# Patient Record
Sex: Female | Born: 1985 | Hispanic: Yes | Marital: Married | Smoking: Never smoker
Health system: Southern US, Community
[De-identification: ages and names within clinical notes are randomized; demographics above are authoritative.]

## PROBLEM LIST (undated history)

## (undated) HISTORY — PX: CHOLECYSTECTOMY: SHX55

## (undated) HISTORY — PX: APPENDECTOMY: SHX54

---

## 2008-11-16 ENCOUNTER — Inpatient Hospital Stay: Payer: Self-pay | Admitting: Vascular Surgery

## 2010-05-22 ENCOUNTER — Observation Stay: Payer: Self-pay | Admitting: Obstetrics and Gynecology

## 2010-06-10 ENCOUNTER — Observation Stay: Payer: Self-pay | Admitting: Obstetrics and Gynecology

## 2010-08-19 ENCOUNTER — Observation Stay: Payer: Self-pay | Admitting: Obstetrics and Gynecology

## 2010-09-25 ENCOUNTER — Inpatient Hospital Stay: Payer: Self-pay

## 2010-09-30 ENCOUNTER — Inpatient Hospital Stay: Payer: Self-pay | Admitting: Surgery

## 2010-10-17 ENCOUNTER — Emergency Department: Payer: Self-pay | Admitting: Emergency Medicine

## 2010-10-19 ENCOUNTER — Inpatient Hospital Stay: Payer: Self-pay | Admitting: Surgery

## 2010-10-22 LAB — PATHOLOGY REPORT

## 2012-05-03 ENCOUNTER — Observation Stay: Payer: Self-pay

## 2012-05-15 ENCOUNTER — Inpatient Hospital Stay: Payer: Self-pay | Admitting: Obstetrics and Gynecology

## 2012-05-15 LAB — CBC WITH DIFFERENTIAL/PLATELET
Basophil #: 0 10*3/uL (ref 0.0–0.1)
Basophil %: 0.2 %
Eosinophil #: 0 10*3/uL (ref 0.0–0.7)
Eosinophil %: 0.5 %
HCT: 30.1 % — ABNORMAL LOW (ref 35.0–47.0)
HGB: 10.4 g/dL — ABNORMAL LOW (ref 12.0–16.0)
Lymphocyte %: 22.4 %
MCH: 27.7 pg (ref 26.0–34.0)
MCHC: 34.5 g/dL (ref 32.0–36.0)
MCV: 80 fL (ref 80–100)
Monocyte #: 0.6 x10 3/mm (ref 0.2–0.9)
Neutrophil %: 69.1 %
Platelet: 221 10*3/uL (ref 150–440)
RDW: 15.3 % — ABNORMAL HIGH (ref 11.5–14.5)

## 2014-12-19 NOTE — H&P (Signed)
L&D Evaluation:  History:   HPI 29 y/o G2P1001 @ 40/1wks EDC4 05/14/12 arrives with c/o irregular contractions and "baby moving less than normal". Denies leaking fluid, small bloody show. Care @ [facility], well pregnancy    Presents with abdominal pain, decreased fetal movement    Patient's Medical History No Chronic Illness    Patient's Surgical History Appendectomy  Colecystectomy    Medications Pre Natal Vitamins    Allergies NKDA    Social History none    Family History Non-Contributory   ROS:   ROS All systems were reviewed.  HEENT, CNS, GI, GU, Respiratory, CV, Renal and Musculoskeletal systems were found to be normal.   Exam:   Vital Signs stable    General no apparent distress    Mental Status clear    Chest clear    Heart normal sinus rhythm    Abdomen gravid, non-tender    Estimated Fetal Weight Average for gestational age    Fetal Position vtx    Fundal Height term    Back no CVAT    Edema no edema    Reflexes 1+    Clonus negative    Pelvic no external lesions, 5cm 70% vtx @ -1 BOWi nl show    Mebranes Intact    FHT normal rate with no decels, baseline 140's 150's avg variability occ accel    Fetal Heart Rate 144    Ucx irregular    Skin dry    Lymph no lymphadenopathy   Impression:   Impression early labor   Plan:   Plan monitor contractions and for cervical change    Comments FHR decel while pt moving in bed? Down to 90-100's x 2 minutes, back to baseline with repositioing. Admitted knows what to expect 2nd baby. Will begin gentle pitocin augment. Plans epidural with labor progress. Family supportive, at bedside.   Electronic Signatures: Albertina ParrLugiano, Ferol Laiche B (CNM)  (Signed 05-Oct-13 19:45)  Authored: L&D Evaluation   Last Updated: 05-Oct-13 19:45 by Albertina ParrLugiano, Rakesha Dalporto B (CNM)

## 2016-02-09 ENCOUNTER — Encounter: Payer: Self-pay | Admitting: Emergency Medicine

## 2016-02-09 ENCOUNTER — Emergency Department: Payer: Managed Care, Other (non HMO)

## 2016-02-09 ENCOUNTER — Emergency Department
Admission: EM | Admit: 2016-02-09 | Discharge: 2016-02-09 | Disposition: A | Discharge status: Home / Self Care | Payer: Managed Care, Other (non HMO) | Attending: Emergency Medicine | Admitting: Emergency Medicine

## 2016-02-09 DIAGNOSIS — W1839XA Other fall on same level, initial encounter: Secondary | ICD-10-CM | POA: Diagnosis not present

## 2016-02-09 DIAGNOSIS — S8992XA Unspecified injury of left lower leg, initial encounter: Secondary | ICD-10-CM | POA: Diagnosis present

## 2016-02-09 DIAGNOSIS — S90112A Contusion of left great toe without damage to nail, initial encounter: Secondary | ICD-10-CM | POA: Diagnosis not present

## 2016-02-09 DIAGNOSIS — Y9289 Other specified places as the place of occurrence of the external cause: Secondary | ICD-10-CM | POA: Diagnosis not present

## 2016-02-09 DIAGNOSIS — Y999 Unspecified external cause status: Secondary | ICD-10-CM | POA: Insufficient documentation

## 2016-02-09 DIAGNOSIS — Y9301 Activity, walking, marching and hiking: Secondary | ICD-10-CM | POA: Diagnosis not present

## 2016-02-09 DIAGNOSIS — W19XXXA Unspecified fall, initial encounter: Secondary | ICD-10-CM

## 2016-02-09 DIAGNOSIS — S80812A Abrasion, left lower leg, initial encounter: Secondary | ICD-10-CM | POA: Diagnosis not present

## 2016-02-09 DIAGNOSIS — S93491A Sprain of other ligament of right ankle, initial encounter: Secondary | ICD-10-CM | POA: Diagnosis not present

## 2016-02-09 MED ORDER — MELOXICAM 15 MG PO TABS: 15.0000 mg | ORAL_TABLET | Freq: Every day | ORAL | Status: DC

## 2016-02-09 NOTE — ED Provider Notes (Signed)
[facility] Emergency Department Provider Note  ____________________________________________  Time seen: Approximately 7:53 PM  I have reviewed the triage vital signs and the nursing notes.   HISTORY  Chief Complaint Fall    HPI Sonia Cunningham is a 30 y.o. female who is his emergency department complaining of right ankle pain and left great toe pain. Patient states that she was walking down her driveway when she accidentally rolled her right ankle and fell. Patient states in that process she suffered abrasions to her left shin and hurt her great toe left foot. Patient denies hitting her head or lose consciousness. Patient reports that the pain to the right ankle is in the posterior lateral aspect of the ankle. Patient is current on all immunizations. She has not had any medication prior to arrival. Patient is able to bear weight on both feet.   History reviewed. No pertinent past medical history.  There are no active problems to display for this patient.   Past Surgical History  Procedure Laterality Date  . Appendectomy    . Cholecystectomy      Current Outpatient Rx  Name  Route  Sig  Dispense  Refill  . meloxicam (MOBIC) 15 MG tablet   Oral   Take 1 tablet (15 mg total) by mouth daily.   30 tablet   0     Allergies Review of patient's allergies indicates no known allergies.  No family history on file.  Social History Social History  Substance Use Topics  . Smoking status: Never Smoker   . Smokeless tobacco: None  . Alcohol Use: No     Review of Systems  Constitutional: No fever/chills Cardiovascular: no chest pain. Respiratory: no cough. No SOB. Musculoskeletal: Positive for right ankle pain. Positive for left great toe pain. Skin: Positive for abrasions to the left shin. Neurological: Negative for headaches, focal weakness or numbness. 10-point ROS otherwise negative.  ____________________________________________   PHYSICAL  EXAM:  VITAL SIGNS: ED Triage Vitals  Enc Vitals Group     BP 02/09/16 1814 123/85 mmHg     Pulse Rate 02/09/16 1814 77     Resp 02/09/16 1814 18     Temp 02/09/16 1814 98.6 F (37 C)     Temp Source 02/09/16 1814 Oral     SpO2 --      Weight 02/09/16 1814 155 lb (70.308 kg)     Height 02/09/16 1814 5\' 5"  (1.651 m)     Head Cir --      Peak Flow --      Pain Score 02/09/16 1818 6     Pain Loc --      Pain Edu? --      Excl. in GC? --      Constitutional: Alert and oriented. Well appearing and in no acute distress. Eyes: Conjunctivae are normal. PERRL. EOMI. Head: Atraumatic. Cardiovascular: Normal rate, regular rhythm. Normal S1 and S2.  Good peripheral circulation. Respiratory: Normal respiratory effort without tachypnea or retractions. Lungs CTAB. Good air entry to the bases with no decreased or absent breath sounds. Musculoskeletal: No visible deformity noted to right ankle but inspection. Full range of motion to ankle joint. Patient is diffusely tender to palpation along the posterior talofibular ligament. No palpable abnormality. Dorsalis pedis pulse intact. Sensation intact all digits right foot. No deformity noted to great toe left foot. Patient is a little flex and extend toe appropriately. Patient is tender to palpation over the proximal phalanx. No palpable abnormality. Sensation  and cap refill intact to this digit. Neurologic:  Normal speech and language. No gross focal neurologic deficits are appreciated.  Skin:  Skin is warm, dry and intact. No rash noted. 2 abrasions are noted to the anterior left lower leg. No bleeding at This time. No visible foreign body. Areas are scabbed over. Psychiatric: Mood and affect are normal. Speech and behavior are normal. Patient exhibits appropriate insight and judgement.   ____________________________________________   LABS (all labs ordered are listed, but only abnormal results are displayed)  Labs Reviewed - No data to  display ____________________________________________  EKG   ____________________________________________  RADIOLOGY Festus BarrenI, Leman Martinek D Bronco Mcgrory, personally viewed and evaluated these images (plain radiographs) as part of my medical decision making, as well as reviewing the written report by the radiologist.  Dg Ankle Complete Right  02/09/2016  CLINICAL DATA:  30 year old female with fall and right ankle pain. EXAM: RIGHT ANKLE - COMPLETE 3+ VIEW COMPARISON:  None. FINDINGS: There is no acute fracture or dislocation. The ankle mortise is intact there is mild degenerative changes and osteophyte formation a small posterior aspect of the ankle joint. The soft tissues appear unremarkable. No radiopaque foreign object. IMPRESSION: Negative. Electronically Signed   By: Elgie CollardArash  Radparvar M.D.   On: 02/09/2016 19:32   Dg Foot Complete Left  02/09/2016  CLINICAL DATA:  30 year old female with fall and left foot injury EXAM: LEFT FOOT - COMPLETE 3+ VIEW COMPARISON:  None. FINDINGS: There is no evidence of fracture or dislocation. There is no evidence of arthropathy or other focal bone abnormality. Soft tissues are unremarkable. IMPRESSION: Negative. Electronically Signed   By: Elgie CollardArash  Radparvar M.D.   On: 02/09/2016 19:40    ____________________________________________    PROCEDURES  Procedure(s) performed:       Medications - No data to display   ____________________________________________   INITIAL IMPRESSION / ASSESSMENT AND PLAN / ED COURSE  Pertinent labs & imaging results that were available during my care of the patient were reviewed by me and considered in my medical decision making (see chart for details).  Patient's diagnosis is consistent with right ankle sprain, left toe contusion, abrasions to the left leg. Exam is reassuring. Imaging reveals no acute osseous abnormality. Patient is given Ace bandage in the emergency department.. Patient will be discharged home with prescriptions  for anti-inflammatories for symptom relief. Patient is to follow up with primary care provider as needed or otherwise directed. Patient is given ED precautions to return to the ED for any worsening or new symptoms.     ____________________________________________  FINAL CLINICAL IMPRESSION(S) / ED DIAGNOSES  Final diagnoses:  Sprain of posterior talofibular ligament of ankle, right, initial encounter  Contusion of great toe of left foot, initial encounter  Abrasion of left leg, initial encounter      NEW MEDICATIONS STARTED DURING THIS VISIT:  New Prescriptions   MELOXICAM (MOBIC) 15 MG TABLET    Take 1 tablet (15 mg total) by mouth daily.        This chart was dictated using voice recognition software/Dragon. Despite best efforts to proofread, errors can occur which can change the meaning. Any change was purely unintentional.    Racheal PatchesJonathan D Ramiya Delahunty, PA-C 02/09/16 1959  Jennye MoccasinBrian S Quigley, MD 02/09/16 2051

## 2016-02-09 NOTE — ED Notes (Signed)
Fell approx 20 min ago, pain R ankle and L great toe.

## 2016-02-09 NOTE — ED Notes (Signed)
Patient states that she was walking down her driveway and stepped wrong rolling her right ankle and falling. Patient c/o pain in the right ankle and left great toe.

## 2016-02-09 NOTE — Discharge Instructions (Signed)
Abrasion An abrasion is a cut or scrape on the outer surface of your skin. An abrasion does not extend through all of the layers of your skin. It is important to care for your abrasion properly to prevent infection. CAUSES Most abrasions are caused by falling on or gliding across the ground or another surface. When your skin rubs on something, the outer and inner layer of skin rubs off.  SYMPTOMS A cut or scrape is the main symptom of this condition. The scrape may be bleeding, or it may appear red or pink. If there was an associated fall, there may be an underlying bruise. DIAGNOSIS An abrasion is diagnosed with a physical exam. TREATMENT Treatment for this condition depends on how large and deep the abrasion is. Usually, your abrasion will be cleaned with water and mild soap. This removes any dirt or debris that may be stuck. An antibiotic ointment may be applied to the abrasion to help prevent infection. A bandage (dressing) may be placed on the abrasion to keep it clean. You may also need a tetanus shot. HOME CARE INSTRUCTIONS Medicines  Take or apply medicines only as directed by your health care provider.  If you were prescribed an antibiotic ointment, finish all of it even if you start to feel better. Wound Care  Clean the wound with mild soap and water 2-3 times per day or as directed by your health care provider. Pat your wound dry with a clean towel. Do not rub it.  There are many different ways to close and cover a wound. Follow instructions from your health care provider about:  Wound care.  Dressing changes and removal.  Check your wound every day for signs of infection. Watch for:  Redness, swelling, or pain.  Fluid, blood, or pus. General Instructions  Keep the dressing dry as directed by your health care provider. Do not take baths, swim, use a hot tub, or do anything that would put your wound underwater until your health care provider approves.  If there is  swelling, raise (elevate) the injured area above the level of your heart while you are sitting or lying down.  Keep all follow-up visits as directed by your health care provider. This is important. SEEK MEDICAL CARE IF:  You received a tetanus shot and you have swelling, severe pain, redness, or bleeding at the injection site.  Your pain is not controlled with medicine.  You have increased redness, swelling, or pain at the site of your wound. SEEK IMMEDIATE MEDICAL CARE IF:  You have a red streak going away from your wound.  You have a fever.  You have fluid, blood, or pus coming from your wound.  You notice a bad smell coming from your wound or your dressing.   This information is not intended to replace advice given to you by your health care provider. Make sure you discuss any questions you have with your health care provider.   Document Released: 05/07/2005 Document Revised: 04/18/2015 Document Reviewed: 07/26/2014 Elsevier Interactive Patient Education 2016 [employer]  Ankle Sprain An ankle sprain is an injury to the strong, fibrous tissues (ligaments) that hold the bones of your ankle joint together.  CAUSES An ankle sprain is usually caused by a fall or by twisting your ankle. Ankle sprains most commonly occur when you step on the outer edge of your foot, and your ankle turns inward. People who participate in sports are more prone to these types of injuries.  SYMPTOMS   Pain  in your ankle. The pain may be present at rest or only when you are trying to stand or walk.  Swelling.  Bruising. Bruising may develop immediately or within 1 to 2 days after your injury.  Difficulty standing or walking, particularly when turning corners or changing directions. DIAGNOSIS  Your caregiver will ask you details about your injury and perform a physical exam of your ankle to determine if you have an ankle sprain. During the physical exam, your caregiver will press on and apply  pressure to specific areas of your foot and ankle. Your caregiver will try to move your ankle in certain ways. An X-ray exam may be done to be sure a bone was not broken or a ligament did not separate from one of the bones in your ankle (avulsion fracture).  TREATMENT  Certain types of braces can help stabilize your ankle. Your caregiver can make a recommendation for this. Your caregiver may recommend the use of medicine for pain. If your sprain is severe, your caregiver may refer you to a surgeon who helps to restore function to parts of your skeletal system (orthopedist) or a physical therapist. HOME CARE INSTRUCTIONS   Apply ice to your injury for 1-2 days or as directed by your caregiver. Applying ice helps to reduce inflammation and pain.  Put ice in a plastic bag.  Place a towel between your skin and the bag.  Leave the ice on for 15-20 minutes at a time, every 2 hours while you are awake.  Only take over-the-counter or prescription medicines for pain, discomfort, or fever as directed by your caregiver.  Elevate your injured ankle above the level of your heart as much as possible for 2-3 days.  If your caregiver recommends crutches, use them as instructed. Gradually put weight on the affected ankle. Continue to use crutches or a cane until you can walk without feeling pain in your ankle.  If you have a plaster splint, wear the splint as directed by your caregiver. Do not rest it on anything harder than a pillow for the first 24 hours. Do not put weight on it. Do not get it wet. You may take it off to take a shower or bath.  You may have been given an elastic bandage to wear around your ankle to provide support. If the elastic bandage is too tight (you have numbness or tingling in your foot or your foot becomes cold and blue), adjust the bandage to make it comfortable.  If you have an air splint, you may blow more air into it or let air out to make it more comfortable. You may take your  splint off at night and before taking a shower or bath. Wiggle your toes in the splint several times per day to decrease swelling. SEEK MEDICAL CARE IF:   You have rapidly increasing bruising or swelling.  Your toes feel extremely cold or you lose feeling in your foot.  Your pain is not relieved with medicine. SEEK IMMEDIATE MEDICAL CARE IF:  Your toes are numb or blue.  You have severe pain that is increasing. MAKE SURE YOU:   Understand these instructions.  Will watch your condition.  Will get help right away if you are not doing well or get worse.   This information is not intended to replace advice given to you by your health care provider. Make sure you discuss any questions you have with your health care provider.   Document Released: 07/28/2005 Document Revised: 08/18/2014 Document  Reviewed: 08/09/2011 Elsevier Interactive Patient Education 2016 Else[employer] Inc.  Adult nurselastic Bandage and RICE WHAT DOES AN ELASTIC BANDAGE DO? Elastic bandages come in different shapes and sizes. They generally provide support to your injury and reduce swelling while you are healing, but they can perform different functions. Your health care provider will help you to decide what is best for your protection, recovery, or rehabilitation following an injury. WHAT ARE SOME GENERAL TIPS FOR USING AN ELASTIC BANDAGE?  Use the bandage as directed by the maker of the bandage that you are using.  Do not wrap the bandage too tightly. This may cut off the circulation in the arm or leg in the area below the bandage.  If part of your body beyond the bandage becomes blue, numb, cold, swollen, or is more painful, your bandage is most likely too tight. If this occurs, remove your bandage and reapply it more loosely.  See your health care provider if the bandage seems to be making your problems worse rather than better.  An elastic bandage should be removed and reapplied every 3-4 hours or as directed by your  health care provider. WHAT IS RICE? The routine care of many injuries includes rest, ice, compression, and elevation (RICE therapy).  Rest Rest is required to allow your body to heal. Generally, you can resume your routine activities when you are comfortable and have been given permission by your health care provider. Ice Icing your injury helps to keep the swelling down and it reduces pain. Do not apply ice directly to your skin.  Put ice in a plastic bag.  Place a towel between your skin and the bag.  Leave the ice on for 20 minutes, 2-3 times per day. Do this for as long as you are directed by your health care provider. Compression Compression helps to keep swelling down, gives support, and helps with discomfort. Compression may be done with an elastic bandage. Elevation Elevation helps to reduce swelling and it decreases pain. If possible, your injured area should be placed at or above the level of your heart or the center of your chest. WHEN SHOULD I SEEK MEDICAL CARE? You should seek medical care if:  You have persistent pain and swelling.  Your symptoms are getting worse rather than improving. These symptoms may indicate that further evaluation or further X-rays are needed. Sometimes, X-rays may not show a small broken bone (fracture) until a number of days later. Make a follow-up appointment with your health care provider. Ask when your X-ray results will be ready. Make sure that you get your X-ray results. WHEN SHOULD I SEEK IMMEDIATE MEDICAL CARE? You should seek immediate medical care if:  You have a sudden onset of severe pain at or below the area of your injury.  You develop redness or increased swelling around your injury.  You have tingling or numbness at or below the area of your injury that does not improve after you remove the elastic bandage.   This information is not intended to replace advice given to you by your health care provider. Make sure you discuss any  questions you have with your health care provider.   Document Released: 01/17/2002 Document Revised: 04/18/2015 Document Reviewed: 03/13/2014 Elsevier Interactive Patient Education [employer]2016 [employer]

## 2019-03-28 LAB — OB RESULTS CONSOLE HIV ANTIBODY (ROUTINE TESTING): HIV: NONREACTIVE

## 2019-04-11 ENCOUNTER — Emergency Department
Admission: EM | Admit: 2019-04-11 | Discharge: 2019-04-11 | Disposition: A | Discharge status: Home / Self Care | Payer: 59 | Attending: Student | Admitting: Student

## 2019-04-11 ENCOUNTER — Emergency Department: Payer: 59

## 2019-04-11 ENCOUNTER — Other Ambulatory Visit: Payer: Self-pay

## 2019-04-11 DIAGNOSIS — S3992XA Unspecified injury of lower back, initial encounter: Secondary | ICD-10-CM | POA: Diagnosis present

## 2019-04-11 DIAGNOSIS — Y92410 Unspecified street and highway as the place of occurrence of the external cause: Secondary | ICD-10-CM | POA: Diagnosis not present

## 2019-04-11 DIAGNOSIS — Y999 Unspecified external cause status: Secondary | ICD-10-CM | POA: Diagnosis not present

## 2019-04-11 DIAGNOSIS — Y939 Activity, unspecified: Secondary | ICD-10-CM | POA: Insufficient documentation

## 2019-04-11 DIAGNOSIS — S39012A Strain of muscle, fascia and tendon of lower back, initial encounter: Secondary | ICD-10-CM

## 2019-04-11 LAB — POCT PREGNANCY, URINE: Preg Test, Ur: NEGATIVE

## 2019-04-11 MED ORDER — MELOXICAM 15 MG PO TABS: 15.0000 mg | ORAL_TABLET | Freq: Every day | ORAL | 2 refills | Status: AC

## 2019-04-11 MED ORDER — BACLOFEN 10 MG PO TABS: 10.0000 mg | ORAL_TABLET | Freq: Three times a day (TID) | ORAL | 1 refills | Status: AC

## 2019-04-11 MED ORDER — TRAMADOL HCL 50 MG PO TABS
50.0000 mg | ORAL_TABLET | Freq: Once | ORAL | Status: AC
  Administered 2019-04-11: 22:00:00 50 mg via ORAL
  Filled 2019-04-11: qty 1

## 2019-04-11 MED ORDER — TRAMADOL HCL 50 MG PO TABS: 50.0000 mg | ORAL_TABLET | Freq: Four times a day (QID) | ORAL | 0 refills | Status: DC | PRN

## 2019-04-11 MED ORDER — CYCLOBENZAPRINE HCL 10 MG PO TABS
10.0000 mg | ORAL_TABLET | Freq: Once | ORAL | Status: AC
  Administered 2019-04-11: 22:00:00 10 mg via ORAL
  Filled 2019-04-11: qty 1
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
## 2019-04-11 NOTE — Discharge Instructions (Addendum)
Follow up with orthopedics or a chiropractor if not better in 1 week.  Use the medication as prescribed.  Return to the ER if worsening

## 2019-04-11 NOTE — ED Notes (Signed)
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
## 2019-08-12 NOTE — L&D Delivery Note (Signed)
Delivery Note  Date of delivery: 04/19/2020 Estimated Date of Delivery: 04/23/20 No LMP recorded. EGA: [redacted]w[redacted]d  Delivery Note At 11:05 AM a viable female was delivered via Vaginal, Spontaneous (Presentation:OA).  APGAR: 8, 9; weight 7 lb 15 oz (3600 g).   Placenta status: Spontaneous, Intact.  Cord: 3 vessels with the following complications: None.   Anesthesia: Epidural Episiotomy: None Lacerations: 1st degree Suture Repair: 3.0 vicryl Est. Blood Loss (mL): 323  First Stage: Labor onset: 0700 Augmentation : Pitocin Analgesia /Anesthesia intrapartum: Epidural SROM at 1230  Sonia Cunningham presented to L&D with active labor. She was augmented with pitocin. Epidural placed.   Second Stage: Complete dilation at 1033 Onset of pushing at 1100 FHR second stage Cat I Delivery at 1105 on 04/19/2020  She progressed to complete and had a spontaneous vaginal birth of a live female over an intact perineum. The fetal head was delivered in OA position with restitution to LOA. No nuchal cord. Anterior then posterior shoulders delivered spontaneously. Baby placed on mom's abdomen and attended to by transition RN. Cord clamped and cut when pulseless by FOB. Cord blood obtained for newborn labs.  Third Stage: Placenta delivered intact with 3VC at 1121 Placenta disposition: routine disposal Uterine tone firm / bleeding min IV pitocin given for hemorrhage prophylaxis  1st degree vaginal laceration identified  Anesthesia for repair: Epidural Repair 3.0 vicryl Est. Blood Loss (mL): 323  Complications: none  Mom to postpartum.  Baby to Couplet care / Skin to Skin.  Newborn: Birth Weight: 7 lb 15 oz (3600 g).   Apgar Scores: 8, 9 Feeding planned: Breast   Sonia Cunningham, CNM 04/19/2020 10:45 PM

## 2019-10-14 LAB — OB RESULTS CONSOLE VARICELLA ZOSTER ANTIBODY, IGG: Varicella: IMMUNE

## 2019-10-14 LAB — OB RESULTS CONSOLE RUBELLA ANTIBODY, IGM: Rubella: IMMUNE

## 2019-10-14 LAB — OB RESULTS CONSOLE HEPATITIS B SURFACE ANTIGEN: Hepatitis B Surface Ag: NEGATIVE

## 2020-01-24 ENCOUNTER — Emergency Department: Payer: 59

## 2020-01-24 ENCOUNTER — Encounter: Payer: Self-pay | Admitting: Emergency Medicine

## 2020-01-24 ENCOUNTER — Other Ambulatory Visit: Payer: Self-pay

## 2020-01-24 DIAGNOSIS — Z3A27 27 weeks gestation of pregnancy: Secondary | ICD-10-CM | POA: Diagnosis not present

## 2020-01-24 DIAGNOSIS — O99891 Other specified diseases and conditions complicating pregnancy: Secondary | ICD-10-CM | POA: Insufficient documentation

## 2020-01-24 DIAGNOSIS — R0602 Shortness of breath: Secondary | ICD-10-CM | POA: Insufficient documentation

## 2020-01-24 DIAGNOSIS — J9811 Atelectasis: Secondary | ICD-10-CM | POA: Diagnosis not present

## 2020-01-24 DIAGNOSIS — E876 Hypokalemia: Secondary | ICD-10-CM | POA: Diagnosis not present

## 2020-01-24 LAB — CBC
HCT: 29.5 % — ABNORMAL LOW (ref 36.0–46.0)
Hemoglobin: 9.8 g/dL — ABNORMAL LOW (ref 12.0–15.0)
MCH: 28.7 pg (ref 26.0–34.0)
MCHC: 33.2 g/dL (ref 30.0–36.0)
MCV: 86.3 fL (ref 80.0–100.0)
Platelets: 292 10*3/uL (ref 150–400)
RBC: 3.42 MIL/uL — ABNORMAL LOW (ref 3.87–5.11)
RDW: 12.8 % (ref 11.5–15.5)
WBC: 11.2 10*3/uL — ABNORMAL HIGH (ref 4.0–10.5)
nRBC: 0 % (ref 0.0–0.2)

## 2020-01-24 LAB — BASIC METABOLIC PANEL
Anion gap: 10 (ref 5–15)
BUN: 10 mg/dL (ref 6–20)
CO2: 21 mmol/L — ABNORMAL LOW (ref 22–32)
Calcium: 8.6 mg/dL — ABNORMAL LOW (ref 8.9–10.3)
Chloride: 103 mmol/L (ref 98–111)
Creatinine, Ser: 0.66 mg/dL (ref 0.44–1.00)
GFR calc Af Amer: >60 mL/min (ref >60)
GFR calc non Af Amer: >60 mL/min (ref >60)
Glucose, Bld: 127 mg/dL — ABNORMAL HIGH (ref 70–99)
Potassium: 3.1 mmol/L — ABNORMAL LOW (ref 3.5–5.1)
Sodium: 134 mmol/L — ABNORMAL LOW (ref 135–145)

## 2020-01-24 LAB — TROPONIN I (HIGH SENSITIVITY): Troponin I (High Sensitivity): 2 ng/L (ref <18)

## 2020-01-24 NOTE — ED Notes (Addendum)
FHTs 146, located mid abd

## 2020-01-24 NOTE — ED Triage Notes (Signed)
Pt to ED from home c/o SOB for 3 days.  Pt is [redacted] weeks pregnant, due date September 15, G3 P2.  Pt denies pain, denies back pain, denies bleeding or fluid leaking.  Pt states SOB feels different than normal because no relief when she lays back or sits down.

## 2020-01-25 ENCOUNTER — Emergency Department
Admission: EM | Admit: 2020-01-25 | Discharge: 2020-01-25 | Disposition: A | Discharge status: Home / Self Care | Payer: 59 | Attending: Emergency Medicine | Admitting: Emergency Medicine

## 2020-01-25 ENCOUNTER — Emergency Department: Payer: 59

## 2020-01-25 DIAGNOSIS — O99891 Other specified diseases and conditions complicating pregnancy: Secondary | ICD-10-CM | POA: Diagnosis not present

## 2020-01-25 DIAGNOSIS — J9811 Atelectasis: Secondary | ICD-10-CM

## 2020-01-25 DIAGNOSIS — E876 Hypokalemia: Secondary | ICD-10-CM

## 2020-01-25 DIAGNOSIS — R0602 Shortness of breath: Secondary | ICD-10-CM

## 2020-01-25 MED ORDER — IOHEXOL 350 MG/ML SOLN
100.0000 mL | Freq: Once | INTRAVENOUS | Status: AC | PRN
  Administered 2020-01-25: 100 mL via INTRAVENOUS

## 2020-01-25 MED ORDER — POTASSIUM CHLORIDE CRYS ER 20 MEQ PO TBCR
40.0000 meq | EXTENDED_RELEASE_TABLET | Freq: Once | ORAL | Status: AC
  Administered 2020-01-25: 40 meq via ORAL
  Filled 2020-01-25: qty 2

## 2020-01-25 MED ORDER — SODIUM CHLORIDE 0.9 % IV BOLUS
1000.0000 mL | Freq: Once | INTRAVENOUS | Status: AC
  Administered 2020-01-25: 1000 mL via INTRAVENOUS

## 2020-01-25 MED ORDER — ALBUTEROL SULFATE HFA 108 (90 BASE) MCG/ACT IN AERS: 2.0000 | INHALATION_SPRAY | RESPIRATORY_TRACT | 0 refills | Status: DC | PRN

## 2020-01-25 NOTE — ED Notes (Signed)
Pt transported to CT at this time.

## 2020-01-25 NOTE — Discharge Instructions (Addendum)
1.  You may use Albuterol inhaler 2 puffs every 4 hours as needed for difficulty breathing. 2.  Return to the ER for worsening symptoms, persistent vomiting, difficulty breathing or other concerns. 

## 2020-01-25 NOTE — ED Provider Notes (Signed)
[facility] Emergency Department Provider Note   ____________________________________________   First MD Initiated Contact with Patient 01/25/20 [phone]     (approximate)  I have reviewed the triage vital signs and the nursing notes.   HISTORY  Chief Complaint Shortness of Breath    HPI Sonia Cunningham is a 34 y.o. female G3P2 approximately [redacted] weeks pregnant who presents to the ED from home with a chief complaint of shortness of breath. Reports shortness of breath on exertion for the past 2 to 3 days. Associated chest tightness only when she is overly exerting herself. States reclining makes the shortness of breath better. Became concerned when she was short of breath at rest. Referred to the ED for further evaluation by her midwife at [facility]. Denies fever, cough, abdominal pain, vaginal bleeding or dysuria. Has had issues with nausea and vomiting. No issues with blood pressure.       Past medical history None  There are no problems to display for this patient.   Past Surgical History:  Procedure Laterality Date  . APPENDECTOMY    . CHOLECYSTECTOMY      Prior to Admission medications   Medication Sig Start Date End Date Taking? Authorizing Provider  albuterol (VENTOLIN HFA) 108 (90 Base) MCG/ACT inhaler Inhale 2 puffs into the lungs every 4 (four) hours as needed for wheezing or shortness of breath. 01/25/20   Paulette Blanch, MD  baclofen (LIORESAL) 10 MG tablet Take 1 tablet (10 mg total) by mouth 3 (three) times daily. 04/11/19 04/10/20  Fisher, Linden Dolin, PA-C  meloxicam (MOBIC) 15 MG tablet Take 1 tablet (15 mg total) by mouth daily. 04/11/19 04/10/20  Fisher, Linden Dolin, PA-C  traMADol (ULTRAM) 50 MG tablet Take 1 tablet (50 mg total) by mouth every 6 (six) hours as needed. 04/11/19   Versie Starks, PA-C    Allergies Patient has no known allergies.  History reviewed. No pertinent family history.  Social History Social History   Tobacco Use  .  Smoking status: Never Smoker  . Smokeless tobacco: Never Used  Substance Use Topics  . Alcohol use: No  . Drug use: Not on file    Review of Systems  Constitutional: No fever/chills Eyes: No visual changes. ENT: No sore throat. Cardiovascular: Denies chest pain. Respiratory: Positive for shortness of breath. Gastrointestinal: No abdominal pain.  No nausea, no vomiting.  No diarrhea.  No constipation. Genitourinary: Negative for dysuria. Musculoskeletal: Negative for back pain. Skin: Negative for rash. Neurological: Negative for headaches, focal weakness or numbness.   ____________________________________________   PHYSICAL EXAM:  VITAL SIGNS: ED Triage Vitals  Enc Vitals Group     BP 01/24/20 2100 126/71     Pulse Rate 01/24/20 2100 83     Resp 01/24/20 2100 20     Temp 01/24/20 2100 98.7 F (37.1 C)     Temp Source 01/24/20 2100 Oral     SpO2 01/24/20 2100 100 %     Weight 01/24/20 2101 148 lb (67.1 kg)     Height 01/24/20 2101 5\' 5"  (1.651 m)     Head Circumference --      Peak Flow --      Pain Score 01/24/20 2101 0     Pain Loc --      Pain Edu? --      Excl. in [place]? --     Constitutional: Alert and oriented. Well appearing and in no acute distress. Eyes: Conjunctivae are normal. PERRL. EOMI.  Head: Atraumatic. Nose: No congestion/rhinnorhea. Mouth/Throat: Mucous membranes are moist.   Neck: No stridor.   Cardiovascular: Normal rate, regular rhythm. Grossly normal heart sounds.  Good peripheral circulation. Respiratory: Normal respiratory effort.  No retractions. Lungs CTAB. Gastrointestinal: Gravid. Soft and nontender to light or deep palpation. No distention. No abdominal bruits. No CVA tenderness. Musculoskeletal: No lower extremity tenderness nor edema. No calf tenderness. No joint effusions. Neurologic:  Normal speech and language. No gross focal neurologic deficits are appreciated. No gait instability. Skin:  Skin is warm, dry and intact. No rash  noted. Psychiatric: Mood and affect are normal. Speech and behavior are normal.  ____________________________________________   LABS (all labs ordered are listed, but only abnormal results are displayed)  Labs Reviewed  BASIC METABOLIC PANEL - Abnormal; Notable for the following components:      Result Value   Sodium 134 (*)    Potassium 3.1 (*)    CO2 21 (*)    Glucose, Bld 127 (*)    Calcium 8.6 (*)    All other components within normal limits  CBC - Abnormal; Notable for the following components:   WBC 11.2 (*)    RBC 3.42 (*)    Hemoglobin 9.8 (*)    HCT 29.5 (*)    All other components within normal limits  TROPONIN I (HIGH SENSITIVITY)   ____________________________________________  EKG  ED ECG REPORT I, Areyana Leoni J, the attending physician, personally viewed and interpreted this ECG.   Date: 01/25/2020  EKG Time: 2058  Rate: 88  Rhythm: normal EKG, normal sinus rhythm  Axis: Normal  Intervals:none  ST&T Change: Nonspecific  ____________________________________________  RADIOLOGY  ED MD interpretation: No acute cardiopulmonary process; CT demonstrates no PE, bibasilar dependent atelectasis  Official radiology report(s): DG Chest 2 View  Result Date: 01/24/2020 CLINICAL DATA:  Shortness of breath EXAM: CHEST - 2 VIEW COMPARISON:  None. FINDINGS: The heart size and mediastinal contours are within normal limits. Both lungs are clear. The visualized skeletal structures are unremarkable. IMPRESSION: No active cardiopulmonary disease. Electronically Signed   By: Jasmine Pang M.D.   On: 01/24/2020 21:22   CT Angio Chest PE W/Cm &/Or Wo Cm  Result Date: 01/25/2020 CLINICAL DATA:  Shortness of breath, [redacted] weeks pregnant EXAM: CT ANGIOGRAPHY CHEST WITH CONTRAST TECHNIQUE: Multidetector CT imaging of the chest was performed using the standard protocol during bolus administration of intravenous contrast. Multiplanar CT image reconstructions and MIPs were obtained to  evaluate the vascular anatomy. CONTRAST:  OMNIPAQUE IOHEXOL 350 MG/ML SOLN COMPARISON:  None. FINDINGS: Cardiovascular: There is a optimal opacification of the pulmonary arteries. There is no central,segmental, or subsegmental filling defects within the pulmonary arteries. The heart is normal in size. No pericardial effusion or thickening. No evidence right heart strain. There is normal three-vessel brachiocephalic anatomy without proximal stenosis. The thoracic aorta is normal in appearance. Mediastinum/Nodes: No hilar, mediastinal, or axillary adenopathy. Thyroid gland, trachea, and esophagus demonstrate no significant findings. Lungs/Pleura: Minimal bibasilar dependent ground-glass atelectasis is noted. No pleural effusion or pneumothorax. No airspace consolidation. Upper Abdomen: No acute abnormalities present in the visualized portions of the upper abdomen. Musculoskeletal: No chest wall abnormality. No acute or significant osseous findings. Review of the MIP images confirms the above findings. IMPRESSION: No central, segmental, or subsegmental pulmonary embolism. Minimal bibasilar dependent atelectasis. Electronically Signed   By: Jonna Clark M.D.   On: 01/25/2020 06:00    ____________________________________________   PROCEDURES  Procedure(s) performed (including Critical Care):  Procedures  ____________________________________________   INITIAL IMPRESSION / ASSESSMENT AND PLAN / ED COURSE  As part of my medical decision making, I reviewed the following data within the electronic MEDICAL RECORD NUMBER History obtained from family, Nursing notes reviewed and incorporated, Labs reviewed, EKG interpreted, Old chart reviewed, Radiograph reviewed and Notes from prior ED visits     Milayah Koy was evaluated in Emergency Department on 01/25/2020 for the symptoms described in the history of present illness. She was evaluated in the context of the global COVID-19 pandemic, which necessitated  consideration that the patient might be at risk for infection with the SARS-CoV-2 virus that causes COVID-19. Institutional protocols and algorithms that pertain to the evaluation of patients at risk for COVID-19 are in a state of rapid change based on information released by regulatory bodies including the CDC and federal and state organizations. These policies and algorithms were followed during the patient's care in the ED.    34 year old female [redacted] weeks pregnant presenting with shortness of breath. Differential includes, but is not limited to, viral syndrome, bronchitis including COPD exacerbation, pneumonia, reactive airway disease including asthma, CHF including exacerbation with or without pulmonary/interstitial edema, pneumothorax, ACS, thoracic trauma, and pulmonary embolism.  Laboratory and imaging results remarkable for mild hypokalemia. Normal EKG, troponin and chest x-ray. We discussed risk/benefits of obtaining CTA chest to evaluate for PE. Patient wishes to proceed.   Clinical Course as of Jan 25 628  Wed Jan 25, 2020  0541 Patient ambulated with pulse oximeter without hypoxia nor tachypnea.  Did get a little dizzy and felt winded but recovered very quickly with rest.   [JS]  5366 Spoke with [facility] midwife [facility] and updated her of work-up including CT imaging results and plan to discharge home on Albuterol inhaler.  Patient will follow up in the clinic this week.  Strict return precautions given.  Patient verbalizes understanding and agrees with plan of care.   [JS]    Clinical Course User Index [JS] Irean Hong, MD     ____________________________________________   FINAL CLINICAL IMPRESSION(S) / ED DIAGNOSES  Final diagnoses:  Shortness of breath  Hypokalemia  Atelectasis     ED Discharge Orders         Ordered    albuterol (VENTOLIN HFA) 108 (90 Base) MCG/ACT inhaler  Every 4 hours PRN     Discontinue  Reprint     01/25/20 0612           Note:  This  document was prepared using Dragon voice recognition software and may include unintentional dictation errors.   Irean Hong, MD 01/25/20 [phone]

## 2020-01-25 NOTE — ED Notes (Signed)
This RN ambulated pt with pulse ox, pt O2 saturation did not decrease, but pt was very winded trying to ambulate and talk.

## 2020-03-27 LAB — OB RESULTS CONSOLE GBS: GBS: NEGATIVE

## 2020-03-27 LAB — OB RESULTS CONSOLE GC/CHLAMYDIA
Chlamydia: NEGATIVE
Gonorrhea: NEGATIVE

## 2020-03-27 LAB — OB RESULTS CONSOLE RPR: RPR: NONREACTIVE

## 2020-04-11 ENCOUNTER — Other Ambulatory Visit: Payer: Self-pay | Admitting: Certified Nurse Midwife

## 2020-04-11 NOTE — Progress Notes (Signed)
Patient pregnant with iron deficiency anemia. Recent hemoglobin 9.3 and ferritin level of 3. Plan to administer ferumoxytol 510mg  weekly x 2 doses. Order for initial infusion placed.   , Genia Del 04/11/2020 3:04 PM

## 2020-04-12 ENCOUNTER — Other Ambulatory Visit: Payer: Self-pay

## 2020-04-12 ENCOUNTER — Ambulatory Visit
Admission: RE | Admit: 2020-04-12 | Discharge: 2020-04-12 | Disposition: A | Discharge status: Home / Self Care | Payer: Medicaid Other | Source: Ambulatory Visit | Attending: Certified Nurse Midwife | Admitting: Certified Nurse Midwife

## 2020-04-12 DIAGNOSIS — O99013 Anemia complicating pregnancy, third trimester: Secondary | ICD-10-CM | POA: Insufficient documentation

## 2020-04-12 DIAGNOSIS — Z3A38 38 weeks gestation of pregnancy: Secondary | ICD-10-CM | POA: Diagnosis not present

## 2020-04-12 DIAGNOSIS — D509 Iron deficiency anemia, unspecified: Secondary | ICD-10-CM | POA: Diagnosis not present

## 2020-04-12 MED ORDER — SODIUM CHLORIDE FLUSH 0.9 % IV SOLN
INTRAVENOUS | Status: AC
  Filled 2020-04-12: qty 10

## 2020-04-12 MED ORDER — SODIUM CHLORIDE 0.9 % IV SOLN
510.0000 mg | Freq: Once | INTRAVENOUS | Status: AC
  Administered 2020-04-12: 510 mg via INTRAVENOUS
  Filled 2020-04-12: qty 17

## 2020-04-15 ENCOUNTER — Observation Stay
Admission: EM | Admit: 2020-04-15 | Discharge: 2020-04-15 | Disposition: A | Discharge status: Home / Self Care | Payer: Medicaid Other | Attending: Obstetrics and Gynecology | Admitting: Obstetrics and Gynecology

## 2020-04-15 ENCOUNTER — Other Ambulatory Visit: Payer: Self-pay

## 2020-04-15 DIAGNOSIS — Z3A38 38 weeks gestation of pregnancy: Secondary | ICD-10-CM | POA: Insufficient documentation

## 2020-04-15 DIAGNOSIS — O368130 Decreased fetal movements, third trimester, not applicable or unspecified: Secondary | ICD-10-CM | POA: Diagnosis present

## 2020-04-15 NOTE — Progress Notes (Signed)
Dating: EDD: 04/23/20  by LMP: 06/15/19 and c/w [place] at 10+1 wks.   Preg c/b: 1. SOB, seen in ER 6/16  Mild hypokalemia, given one time dose of PO K+  Given albuterol inhaler  02/02/20: K+ 3.3; encouraged increased PO sources of potassium in diet.  2. Abnormal 1hr GTT: 138  Scheduled 3hr GTT 02/03/20: Fasting-78,105, 120, 84  3. Iron deficiency Anemia, Hgb 9.8 at 28wks  Taking daily iron supplement  Prenatal Labs: Blood type/Rh O pos  Antibody screen neg  Rubella Immune  Varicella Immune  RPR NR  HBsAg Neg  HIV NR  GC neg  Chlamydia neg  Genetic screening negative  1 hour GTT 138  3 hour GTT  75-105- 120-84  GBS Neg   Tdap declined Flu 04/2019 Mirena IUD breast

## 2020-04-15 NOTE — OB Triage Note (Signed)
Patient here for DFM. Denies LOF or Bleeding.

## 2020-04-15 NOTE — Discharge Summary (Addendum)
Sonia Cunningham is a 34 y.o. female. She is at [redacted]w[redacted]d gestation. No LMP recorded. Patient is pregnant. Estimated Date of Delivery: 04/25/20  Prenatal care site: [facility]  Current pregnancy complicated by:  1. SOB, seen in ER 6/16  Mild hypokalemia, given one time dose of PO K+  Given albuterol inhaler  02/02/20: K+ 3.3; encouraged increased PO sources of potassium in diet.  2. Abnormal 1hr GTT: 138  Scheduled 3hr GTT 02/03/20: Fasting-78,105, 120, 84  3. Iron deficiency Anemia, Hgb 9.8 at 28wks  Taking daily iron supplement  Chief complaint: decreased fetal movement  Location: back pain intermittently Onset/timing: decreased baby movement this morning, very active yesterday.  Duration: since waking up this am, laid Quality:  Severity: Aggravating or alleviating conditions: Associated signs/symptoms: Context:  S: Resting comfortably. no VB.no LOF,  Active fetal movement noted since arrival. Denies: HA, visual changes, SOB, or RUQ/epigastric pain. Feeling mild low back pain that is intermittent.   Maternal Medical History:  History reviewed. No pertinent past medical history.  Past Surgical History:  Procedure Laterality Date  . APPENDECTOMY    . CHOLECYSTECTOMY      No Known Allergies  Prior to Admission medications   Medication Sig Start Date End Date Taking? Authorizing Provider  albuterol (VENTOLIN HFA) 108 (90 Base) MCG/ACT inhaler Inhale 2 puffs into the lungs every 4 (four) hours as needed for wheezing or shortness of breath. 01/25/20   Irean Hong, MD  traMADol (ULTRAM) 50 MG tablet Take 1 tablet (50 mg total) by mouth every 6 (six) hours as needed. 04/11/19   Faythe Ghee, PA-C      Social History: She  reports that she has never smoked. She has never used smokeless tobacco. She reports that she does not drink alcohol.  Family History:  no history of gyn cancers  Review of Systems: A full review of systems was performed and negative except as  noted in the HPI.     O: BP 132/76, temp 98.3  Constitutional: NAD, AAOx3  HE/ENT: extraocular movements grossly intact, moist mucous membranes CV: RRR PULM: nl respiratory effort, CTABL     Abd: gravid, non-tender, non-distended, soft      Ext: Non-tender, Nonedematous   Psych: mood appropriate, speech normal Pelvic: SVE 2/50/-2, soft/posterior, membranes swept at pt request.   Fetal  monitoring: Cat I Appropriate for GA Baseline:  130bpm Variability: moderate Accelerations: present x >2 Decelerations absent Time  Bedside AFI performed:  Q1: no measurable pocket Q2: 1.54cm Q3: 4.08cm Q4: 5.08cm AFI total: 10.7cm Cephalic presentation, FHR 135bpm  A/P: 34 y.o. [redacted]w[redacted]d here for antenatal surveillance for decreased fetal movement  Principle Diagnosis: decreased fetal movement at term   Labor: not present.   Fetal Wellbeing: Reassuring Cat 1 tracing with Reactive NST   AFI WNL  D/c home stable, precautions reviewed, follow-up as scheduled.    Randa Ngo, CNM 04/15/2020  12:36 PM

## 2020-04-19 ENCOUNTER — Encounter: Payer: Self-pay | Admitting: Obstetrics and Gynecology

## 2020-04-19 ENCOUNTER — Observation Stay: Payer: Medicaid Other | Admitting: Anesthesiology

## 2020-04-19 ENCOUNTER — Ambulatory Visit: Payer: Medicaid Other | Attending: Certified Nurse Midwife

## 2020-04-19 ENCOUNTER — Other Ambulatory Visit: Payer: Self-pay

## 2020-04-19 ENCOUNTER — Inpatient Hospital Stay
Admission: EM | Admit: 2020-04-19 | Discharge: 2020-04-20 | DRG: 807 | Disposition: A | Discharge status: Home / Self Care | Payer: Medicaid Other | Attending: Obstetrics and Gynecology | Admitting: Obstetrics and Gynecology

## 2020-04-19 DIAGNOSIS — O479 False labor, unspecified: Secondary | ICD-10-CM | POA: Diagnosis present

## 2020-04-19 DIAGNOSIS — O9902 Anemia complicating childbirth: Principal | ICD-10-CM | POA: Diagnosis present

## 2020-04-19 DIAGNOSIS — Z20822 Contact with and (suspected) exposure to covid-19: Secondary | ICD-10-CM | POA: Diagnosis present

## 2020-04-19 DIAGNOSIS — Z3A39 39 weeks gestation of pregnancy: Secondary | ICD-10-CM | POA: Diagnosis not present

## 2020-04-19 DIAGNOSIS — D509 Iron deficiency anemia, unspecified: Secondary | ICD-10-CM | POA: Diagnosis present

## 2020-04-19 DIAGNOSIS — O26893 Other specified pregnancy related conditions, third trimester: Secondary | ICD-10-CM | POA: Diagnosis present

## 2020-04-19 LAB — BASIC METABOLIC PANEL
Anion gap: 8 (ref 5–15)
BUN: 8 mg/dL (ref 6–20)
CO2: 23 mmol/L (ref 22–32)
Calcium: 8.6 mg/dL — ABNORMAL LOW (ref 8.9–10.3)
Chloride: 104 mmol/L (ref 98–111)
Creatinine, Ser: 0.5 mg/dL (ref 0.44–1.00)
GFR calc Af Amer: >60 mL/min (ref >60)
GFR calc non Af Amer: >60 mL/min (ref >60)
Glucose, Bld: 78 mg/dL (ref 70–99)
Potassium: 3.7 mmol/L (ref 3.5–5.1)
Sodium: 135 mmol/L (ref 135–145)

## 2020-04-19 LAB — CBC
HCT: 29.6 % — ABNORMAL LOW (ref 36.0–46.0)
Hemoglobin: 9.6 g/dL — ABNORMAL LOW (ref 12.0–15.0)
MCH: 26.9 pg (ref 26.0–34.0)
MCHC: 32.4 g/dL (ref 30.0–36.0)
MCV: 82.9 fL (ref 80.0–100.0)
Platelets: 269 10*3/uL (ref 150–400)
RBC: 3.57 MIL/uL — ABNORMAL LOW (ref 3.87–5.11)
RDW: 15.7 % — ABNORMAL HIGH (ref 11.5–15.5)
WBC: 13.6 10*3/uL — ABNORMAL HIGH (ref 4.0–10.5)
nRBC: 0 % (ref 0.0–0.2)

## 2020-04-19 LAB — ABO/RH: ABO/RH(D): O POS

## 2020-04-19 LAB — TYPE AND SCREEN
ABO/RH(D): O POS
Antibody Screen: NEGATIVE

## 2020-04-19 LAB — RUPTURE OF MEMBRANE (ROM)PLUS: Rom Plus: POSITIVE

## 2020-04-19 LAB — SARS CORONAVIRUS 2 BY RT PCR (HOSPITAL ORDER, PERFORMED IN ~~LOC~~ HOSPITAL LAB): SARS Coronavirus 2: NEGATIVE

## 2020-04-19 LAB — RPR: RPR Ser Ql: NONREACTIVE

## 2020-04-19 MED ORDER — ONDANSETRON HCL 4 MG/2ML IJ SOLN
4.0000 mg | Freq: Four times a day (QID) | INTRAMUSCULAR | Status: DC | PRN
  Administered 2020-04-19: 4 mg via INTRAVENOUS
  Filled 2020-04-19 (×2): qty 2

## 2020-04-19 MED ORDER — EPHEDRINE 5 MG/ML INJ: 10.0000 mg | INTRAVENOUS | Status: DC | PRN

## 2020-04-19 MED ORDER — FENTANYL 2.5 MCG/ML W/ROPIVACAINE 0.15% IN NS 100 ML EPIDURAL (ARMC)
EPIDURAL | Status: DC
Start: 2020-04-19 — End: 2020-04-19
  Filled 2020-04-19: qty 100

## 2020-04-19 MED ORDER — FERROUS SULFATE 325 (65 FE) MG PO TABS
325.0000 mg | ORAL_TABLET | Freq: Two times a day (BID) | ORAL | Status: DC
  Administered 2020-04-19 – 2020-04-20 (×2): 325 mg via ORAL
  Filled 2020-04-19 (×2): qty 1

## 2020-04-19 MED ORDER — LIDOCAINE-EPINEPHRINE (PF) 1.5 %-1:200000 IJ SOLN
INTRAMUSCULAR | Status: DC | PRN
  Administered 2020-04-19: 3 mL via EPIDURAL

## 2020-04-19 MED ORDER — OXYCODONE HCL 5 MG PO TABS: 5.0000 mg | ORAL_TABLET | ORAL | Status: DC | PRN

## 2020-04-19 MED ORDER — DIBUCAINE (PERIANAL) 1 % EX OINT: 1.0000 "application " | TOPICAL_OINTMENT | CUTANEOUS | Status: DC | PRN

## 2020-04-19 MED ORDER — LACTATED RINGERS IV SOLN: INTRAVENOUS | Status: DC

## 2020-04-19 MED ORDER — LIDOCAINE HCL (PF) 1 % IJ SOLN: 30.0000 mL | INTRAMUSCULAR | Status: DC | PRN

## 2020-04-19 MED ORDER — ACETAMINOPHEN 325 MG PO TABS: 650.0000 mg | ORAL_TABLET | ORAL | Status: DC | PRN

## 2020-04-19 MED ORDER — COCONUT OIL OIL
1.0000 "application " | TOPICAL_OIL | Status: DC | PRN
  Administered 2020-04-19: 1 via TOPICAL
  Filled 2020-04-19: qty 120

## 2020-04-19 MED ORDER — DOCUSATE SODIUM 100 MG PO CAPS
100.0000 mg | ORAL_CAPSULE | Freq: Two times a day (BID) | ORAL | Status: DC
  Administered 2020-04-19 – 2020-04-20 (×2): 100 mg via ORAL
  Filled 2020-04-19 (×2): qty 1

## 2020-04-19 MED ORDER — AMMONIA AROMATIC IN INHA
RESPIRATORY_TRACT | Status: AC
  Filled 2020-04-19: qty 10

## 2020-04-19 MED ORDER — PHENYLEPHRINE 40 MCG/ML (10ML) SYRINGE FOR IV PUSH (FOR BLOOD PRESSURE SUPPORT): 80.0000 ug | PREFILLED_SYRINGE | INTRAVENOUS | Status: DC | PRN

## 2020-04-19 MED ORDER — BUTORPHANOL TARTRATE 1 MG/ML IJ SOLN: 1.0000 mg | INTRAMUSCULAR | Status: DC | PRN

## 2020-04-19 MED ORDER — OXYTOCIN BOLUS FROM INFUSION: 333.0000 mL | Freq: Once | INTRAVENOUS | Status: DC

## 2020-04-19 MED ORDER — LIDOCAINE HCL (PF) 1 % IJ SOLN
INTRAMUSCULAR | Status: DC | PRN
  Administered 2020-04-19: 3 mL via SUBCUTANEOUS

## 2020-04-19 MED ORDER — FENTANYL 2.5 MCG/ML W/ROPIVACAINE 0.15% IN NS 100 ML EPIDURAL (ARMC)
EPIDURAL | Status: AC
  Filled 2020-04-19: qty 100

## 2020-04-19 MED ORDER — LACTATED RINGERS IV SOLN: 500.0000 mL | INTRAVENOUS | Status: DC | PRN

## 2020-04-19 MED ORDER — OXYTOCIN-SODIUM CHLORIDE 30-0.9 UT/500ML-% IV SOLN
INTRAVENOUS | Status: AC
  Filled 2020-04-19: qty 500

## 2020-04-19 MED ORDER — OXYTOCIN 10 UNIT/ML IJ SOLN
INTRAMUSCULAR | Status: AC
  Filled 2020-04-19: qty 2

## 2020-04-19 MED ORDER — DIPHENHYDRAMINE HCL 25 MG PO CAPS: 25.0000 mg | ORAL_CAPSULE | Freq: Four times a day (QID) | ORAL | Status: DC | PRN

## 2020-04-19 MED ORDER — BUPIVACAINE HCL (PF) 0.25 % IJ SOLN
INTRAMUSCULAR | Status: DC | PRN
  Administered 2020-04-19 (×2): 4 mL via EPIDURAL

## 2020-04-19 MED ORDER — OXYTOCIN-SODIUM CHLORIDE 30-0.9 UT/500ML-% IV SOLN
1.0000 m[IU]/min | INTRAVENOUS | Status: DC
  Administered 2020-04-19: 2 m[IU]/min via INTRAVENOUS

## 2020-04-19 MED ORDER — OXYCODONE-ACETAMINOPHEN 5-325 MG PO TABS: 2.0000 | ORAL_TABLET | ORAL | Status: DC | PRN

## 2020-04-19 MED ORDER — OXYCODONE-ACETAMINOPHEN 5-325 MG PO TABS: 1.0000 | ORAL_TABLET | ORAL | Status: DC | PRN

## 2020-04-19 MED ORDER — ONDANSETRON HCL 4 MG PO TABS
4.0000 mg | ORAL_TABLET | ORAL | Status: DC | PRN
  Filled 2020-04-19: qty 1

## 2020-04-19 MED ORDER — OXYCODONE HCL 5 MG PO TABS: 10.0000 mg | ORAL_TABLET | ORAL | Status: DC | PRN

## 2020-04-19 MED ORDER — ONDANSETRON HCL 4 MG/2ML IJ SOLN
4.0000 mg | Freq: Four times a day (QID) | INTRAMUSCULAR | Status: DC | PRN
  Administered 2020-04-19: 4 mg via INTRAVENOUS

## 2020-04-19 MED ORDER — LIDOCAINE HCL (PF) 1 % IJ SOLN
INTRAMUSCULAR | Status: AC
  Filled 2020-04-19: qty 30

## 2020-04-19 MED ORDER — FENTANYL 2.5 MCG/ML W/ROPIVACAINE 0.15% IN NS 100 ML EPIDURAL (ARMC)
12.0000 mL/h | EPIDURAL | Status: DC
  Administered 2020-04-19: 12 mL/h via EPIDURAL

## 2020-04-19 MED ORDER — WITCH HAZEL-GLYCERIN EX PADS: 1.0000 "application " | MEDICATED_PAD | CUTANEOUS | Status: DC | PRN

## 2020-04-19 MED ORDER — BENZOCAINE-MENTHOL 20-0.5 % EX AERO
1.0000 "application " | INHALATION_SPRAY | CUTANEOUS | Status: DC | PRN
  Administered 2020-04-19: 1 via TOPICAL
  Filled 2020-04-19: qty 56

## 2020-04-19 MED ORDER — MISOPROSTOL 200 MCG PO TABS
ORAL_TABLET | ORAL | Status: AC
  Filled 2020-04-19: qty 4

## 2020-04-19 MED ORDER — TERBUTALINE SULFATE 1 MG/ML IJ SOLN: 0.2500 mg | Freq: Once | INTRAMUSCULAR | Status: DC | PRN

## 2020-04-19 MED ORDER — OXYTOCIN-SODIUM CHLORIDE 30-0.9 UT/500ML-% IV SOLN: 2.5000 [IU]/h | INTRAVENOUS | Status: DC

## 2020-04-19 MED ORDER — SIMETHICONE 80 MG PO CHEW: 80.0000 mg | CHEWABLE_TABLET | ORAL | Status: DC | PRN

## 2020-04-19 MED ORDER — SOD CITRATE-CITRIC ACID 500-334 MG/5ML PO SOLN: 30.0000 mL | ORAL | Status: DC | PRN

## 2020-04-19 MED ORDER — DIPHENHYDRAMINE HCL 50 MG/ML IJ SOLN: 12.5000 mg | INTRAMUSCULAR | Status: DC | PRN

## 2020-04-19 MED ORDER — IBUPROFEN 600 MG PO TABS
600.0000 mg | ORAL_TABLET | Freq: Four times a day (QID) | ORAL | Status: DC
  Administered 2020-04-19 – 2020-04-20 (×4): 600 mg via ORAL
  Filled 2020-04-19 (×4): qty 1

## 2020-04-19 MED ORDER — IBUPROFEN 600 MG PO TABS
600.0000 mg | ORAL_TABLET | Freq: Four times a day (QID) | ORAL | Status: DC
  Administered 2020-04-19: 600 mg via ORAL
  Filled 2020-04-19: qty 1

## 2020-04-19 MED ORDER — BUTORPHANOL TARTRATE 1 MG/ML IJ SOLN
INTRAMUSCULAR | Status: AC
Start: 2020-04-19 — End: 2020-04-19
  Filled 2020-04-19: qty 1

## 2020-04-19 MED ORDER — PRENATAL MULTIVITAMIN CH
1.0000 | ORAL_TABLET | Freq: Every day | ORAL | Status: DC
  Administered 2020-04-19 – 2020-04-20 (×2): 1 via ORAL
  Filled 2020-04-19 (×2): qty 1

## 2020-04-19 MED ORDER — LACTATED RINGERS IV SOLN
500.0000 mL | Freq: Once | INTRAVENOUS | Status: AC
  Administered 2020-04-19: 500 mL via INTRAVENOUS

## 2020-04-19 MED ORDER — TETANUS-DIPHTH-ACELL PERTUSSIS 5-2.5-18.5 LF-MCG/0.5 IM SUSP
0.5000 mL | Freq: Once | INTRAMUSCULAR | Status: DC
  Filled 2020-04-19: qty 0.5

## 2020-04-19 MED ORDER — ONDANSETRON HCL 4 MG/2ML IJ SOLN: 4.0000 mg | INTRAMUSCULAR | Status: DC | PRN

## 2020-04-19 MED ORDER — OXYTOCIN BOLUS FROM INFUSION
333.0000 mL | Freq: Once | INTRAVENOUS | Status: AC
  Administered 2020-04-19: 333 mL via INTRAVENOUS

## 2020-04-19 MED ORDER — BUTORPHANOL TARTRATE 1 MG/ML IJ SOLN
1.0000 mg | INTRAMUSCULAR | Status: DC | PRN
  Administered 2020-04-19: 1 mg via INTRAVENOUS

## 2020-04-19 NOTE — OB Triage Note (Signed)
Pt states that she started having contractions this evening that became more frequent and intense around 3:30. She currently rates pain 10/10. She reports positive fetal movement and spotting. She says she has had a small amount of fluid, but no large gush. EFM applied, will continue to monitor

## 2020-04-19 NOTE — Lactation Note (Signed)
This note was copied from a baby's chart. Lactation Consultation Note  Patient Name: Girl Reylene Stauder [id number] Date: 04/19/2020 Reason for consult: Initial assessment;Mother's request;Term  Initial lactation visit. Mom is G3P3, delivered SVD 2.5hrs ago. Baby latched at breast showing flanged top/bottom lips, strong rhythmic sucking pattern and in good position and alignment. Mom breastfed other 2 children for 78yr, with formula supplementation due to reported low supply. LC provided praise to mom on her position and baby's latch, educated on milk supply and demand, tips for building an adequate supply. Reviewed newborn stomach size, feeding behaviors and patterns, and output expectations in first 24hrs. Encouraged to put baby to breast with early cues, encouraged frequent skin to skin, and hand expression between feedings if needed. Mom had no questions/concerns at this time. Encouraged to call out for lactation support if needed.  Maternal Data Formula Feeding for Exclusion: No Has patient been taught Hand Expression?: Yes Does the patient have breastfeeding experience prior to this delivery?: Yes  Feeding Feeding Type: Breast Fed  LATCH Score Latch: Grasps breast easily, tongue down, lips flanged, rhythmical sucking. (mom had baby positioned before LC entered)  Audible Swallowing: A few with stimulation  Type of Nipple: Everted at rest and after stimulation  Comfort (Breast/Nipple): Soft / non-tender  Hold (Positioning): No assistance needed to correctly position infant at breast. (mom had baby positioned before LC entered)  LATCH Score: 9  Interventions Interventions: Breast feeding basics reviewed;Adjust position;Support pillows  Lactation Tools Discussed/Used     Consult Status Consult Status: PRN    Danford Bad 04/19/2020, 2:35 PM

## 2020-04-19 NOTE — Anesthesia Preprocedure Evaluation (Signed)
Anesthesia Evaluation  Patient identified by MRN, date of birth, ID band Patient awake    Reviewed: Allergy & Precautions, H&P , NPO status , Patient's Chart, lab work & pertinent test results  History of Anesthesia Complications Negative for: history of anesthetic complications  Airway Mallampati: II       Dental no notable dental hx.    Pulmonary neg pulmonary ROS,    Pulmonary exam normal        Cardiovascular Normal cardiovascular exam     Neuro/Psych negative neurological ROS  negative psych ROS   GI/Hepatic   Endo/Other    Renal/GU      Musculoskeletal   Abdominal   Peds  Hematology   Anesthesia Other Findings   Reproductive/Obstetrics (+) Pregnancy                             Anesthesia Physical Anesthesia Plan  ASA: II  Anesthesia Plan: Epidural   Post-op Pain Management:    Induction:   PONV Risk Score and Plan:   Airway Management Planned:   Additional Equipment:   Intra-op Plan:   Post-operative Plan:   Informed Consent: I have reviewed the patients History and Physical, chart, labs and discussed the procedure including the risks, benefits and alternatives for the proposed anesthesia with the patient or authorized representative who has indicated his/her understanding and acceptance.       Plan Discussed with: Anesthesiologist  Anesthesia Plan Comments:         Anesthesia Quick Evaluation

## 2020-04-19 NOTE — Anesthesia Procedure Notes (Signed)
Epidural  Start time: 04/19/2020 7:31 AM  Staffing Resident/CRNA: Stormy Fabian, CRNA Performed: resident/CRNA   Preanesthetic Checklist Completed: patient identified, IV checked, site marked, risks and benefits discussed, surgical consent, monitors and equipment checked and pre-op evaluation  Epidural Patient position: sitting Prep: ChloraPrep and site prepped and draped Patient monitoring: heart rate, continuous pulse ox and blood pressure Approach: midline Location: L3-L4 Injection technique: LOR saline  Needle:  Needle type: Tuohy  Needle gauge: 17 G Needle length: 9 cm Needle insertion depth: 6 cm Catheter type: closed end flexible Catheter size: 19 Gauge Catheter at skin depth: 10 cm Test dose: negative and 1.5% lidocaine with Epi 1:200 K  Assessment Events: blood not aspirated, injection not painful, no injection resistance, no paresthesia and negative IV test  Additional Notes Reason for block:procedure for pain

## 2020-04-19 NOTE — Discharge Summary (Addendum)
Obstetrical Discharge Summary  Patient Name: Sonia Cunningham DOB: 04-23-1986 MRN: [id number]  Date of Admission: 04/19/2020 Date of Delivery: 04/19/20 Delivered by: Haroldine Laws Date of Discharge: 04/20/2020  Primary OB: [facility]  LMP:No LMP recorded. EDC Estimated Date of Delivery: 04/23/20 Gestational Age at Delivery: [redacted]w[redacted]d   Antepartum complications:  1. SOB, seen in ER 6/16 2. Abnormal 1hr GTT: 138 3. Anemia,  Admitting Diagnosis: Active labor Secondary Diagnosis: Patient Active Problem List   Diagnosis Date Noted  . NSVD (normal spontaneous vaginal delivery) 04/19/2020    Augmentation: Pitocin Complications: None  Intrapartum complications/course:  Delivery Type: spontaneous vaginal delivery Anesthesia: epidural Placenta: spontaneous Laceration: 1st degree vaginal lac Episiotomy: none Newborn Data: Live born female  Birth Weight:  pending APGAR: 8, 9   Newborn Delivery   Birth date/time: 04/19/2020 11:05:00 Delivery type: Vaginal, Spontaneous      34yo G4P1021 at 39+4wks presenting with active labor, SROM with clear fluid.  She progressed to complete and pushed over an intact perineum and delivered the fetal head, followed promptly by the shoulders. She was in control the whole time, and the baby placed on the maternal abdomen. Delayed cord clamping and the FOB cut his cord, while he was skin to skin. The placenta delivered spontaneously and intact. Small first degree laceration requiring a stitch. Mom and baby tolerated the procedure well.   Postpartum Procedures: None   Post partum course:  Patient had an uncomplicated postpartum course.  By time of discharge on PPD#1, her pain was controlled on oral pain medications; she had appropriate lochia and was ambulating, voiding without difficulty and tolerating regular diet.  She was deemed stable for discharge to home.    Discharge Physical Exam:  BP 114/76 (BP Location: Left Arm)   Pulse 62   Temp 98.5  F (36.9 C) (Oral)   Resp 18   Ht 5\' 5"  (1.651 m)   Wt 71.2 kg   SpO2 99% Comment: Room Air  Breastfeeding Unknown   BMI 26.13 kg/m   General: alert and no distress Pulm: normal respiratory effort Lochia: appropriate Abdomen: soft, NT Uterine Fundus: firm, below umbilicus Perineum: minimal edema, intact  Extremities: No evidence of DVT seen on physical exam. No lower extremity edema. Edinburgh:  Edinburgh Postnatal Depression Scale Screening Tool 04/20/2020 04/19/2020  I have been able to laugh and see the funny side of things. 0 (No Data)  I have looked forward with enjoyment to things. 0 -  I have blamed myself unnecessarily when things went wrong. 1 -  I have been anxious or worried for no good reason. 1 -  I have felt scared or panicky for no good reason. 0 -  Things have been getting on top of me. 0 -  I have been so unhappy that I have had difficulty sleeping. 0 -  I have felt sad or miserable. 0 -  I have been so unhappy that I have been crying. 0 -  The thought of harming myself has occurred to me. 0 -  Edinburgh Postnatal Depression Scale Total 2 -     Labs: CBC Latest Ref Rng & Units 04/20/2020 04/19/2020 01/24/2020  WBC 4.0 - 10.5 K/uL 14.7(H) 13.6(H) 11.2(H)  Hemoglobin 12.0 - 15.0 g/dL 10.2(L) 9.6(L) 9.8(L)  Hematocrit 36 - 46 % 30.3(L) 29.6(L) 29.5(L)  Platelets 150 - 400 K/uL 268 269 292   O POS Performed at [facility], [address] [place], [place]  Hemoglobin  Date Value Ref  Range Status  04/20/2020 10.2 (L) 12.0 - 15.0 g/dL Final   HGB  Date Value Ref Range Status  05/15/2012 10.4 (L) 12.0 - 16.0 g/dL Final   HCT  Date Value Ref Range Status  04/20/2020 30.3 (L) 36 - 46 % Final  05/17/2012 27.3 (L) 35.0 - 47.0 % Final    Disposition: stable, discharge to home Baby Feeding: breastmilk Baby Disposition: home with mom  Contraception: Mirena  Prenatal Labs:  Blood type/Rh O pos  Antibody screen neg  Rubella Immune   Varicella Immune  RPR NR  HBsAg Neg  HIV NR  GC neg  Chlamydia neg  Genetic screening negative  1 hour GTT 138  3 hour GTT 78, 105, 120, 84   GBS Neg   Rh Immune globulin given: n/a Rubella vaccine given: n/a Varicella vaccine given: n/a Tdap vaccine given in AP or PP setting: declined  Flu vaccine given in AP or PP setting: 04/2019  Plan: Sonia Cunningham was discharged to home in good condition. Follow-up appointment with delivering provider in 6 weeks.  Discharge Instructions: Per After Visit Summary. Activity: Advance as tolerated. Pelvic rest for 6 weeks.   Diet: Regular Contraception: Considering IUD and/or BTL Discharge Medications: Allergies as of 04/20/2020   No Known Allergies     Medication List    STOP taking these medications   traMADol 50 MG tablet Commonly known as: ULTRAM     TAKE these medications   acetaminophen 325 MG tablet Commonly known as: Tylenol Take 2 tablets (650 mg total) by mouth every 4 (four) hours as needed (for pain scale < 4).   albuterol 108 (90 Base) MCG/ACT inhaler Commonly known as: VENTOLIN HFA Inhale 2 puffs into the lungs every 4 (four) hours as needed for wheezing or shortness of breath.   ibuprofen 600 MG tablet Commonly known as: ADVIL Take 1 tablet (600 mg total) by mouth every 6 (six) hours as needed for mild pain or moderate pain.   prenatal multivitamin Tabs tablet Take 1 tablet by mouth daily at 12 noon.      Outpatient follow up:   Follow-up Information    Haroldine Laws, CNM. Schedule an appointment as soon as possible for a visit in 6 week(s).   Specialty: Certified Nurse Midwife Contact information: [address] [place] [address] [phone]               Signed:  Margaretmary Eddy, CNM Certified Nurse Midwife [facility]

## 2020-04-19 NOTE — Progress Notes (Signed)
Labor Progress Note  Sonia Cunningham is a 34 y.o. G3P2002 at [redacted]w[redacted]d by LMP admitted for active labor  Subjective: Pt is laying on her side with nausea.  Comfortable with epidural.  Objective: BP 107/69   Pulse 68   Temp 98.7 F (37.1 C) (Oral)   Resp 18   Ht 5\' 5"  (1.651 m)   Wt 71.2 kg   SpO2 100%   BMI 26.13 kg/m    Fetal Assessment: FHT:  FHR: 130 bpm, variability: moderate,  accelerations:  Present,  decelerations:  Absent Category/reactivity:  Category I UC:   regular, every 4-6 minutes SVE:    Dilation: 6cm  Effacement: 90%  Station:  -1  Consistency: soft  Position: anterior  Membrane status:SROM 0030 Amniotic color: clear  Labs: Lab Results  Component Value Date   WBC 13.6 (H) 04/19/2020   HGB 9.6 (L) 04/19/2020   HCT 29.6 (L) 04/19/2020   MCV 82.9 04/19/2020   PLT 269 04/19/2020    Assessment / Plan: Spontaneous labor, progressing normally - ruptured a forebag, well tolerated  Labor: Progressing normally Preeclampsia:  107/69 Fetal Wellbeing:  Category I Pain Control:  Epidural I/D:  Afebrile, GBS neg, SROM x 8hrs Anticipated MOD:  NSVD  12-07-1968, CNM 04/19/2020, 8:38 AM

## 2020-04-19 NOTE — H&P (Signed)
Sonia Cunningham is a 34 y.o. female presenting for  Labor  LOF stating at midnight .Marland KitchenEGA 39+3 . Regular CTX  Factors complicating this pregnancy  1. SOB, seen in ER 6/16  Mild hypokalemia, given one time dose of PO K+  Given albuterol inhaler  02/02/20: K+ 3.3; encouraged increased PO sources of potassium in diet.  2. Abnormal 1hr GTT: 138  Scheduled 3hr GTT 02/03/20: Fasting-78,105, 120, 84  3. Anemia,  a. Hgb 9.8 at 28wks  Taking daily iron supplement  Hemoglobin 9.5 at 36w  CBC and ferritin ordered 04/11/2020 at [redacted]w[redacted]d OB History    Gravida  3   Para  2   Term  2   Preterm      AB      Living        SAB      TAB      Ectopic      Multiple      Live Births             History reviewed. No pertinent past medical history. Past Surgical History:  Procedure Laterality Date  . APPENDECTOMY    . CHOLECYSTECTOMY     Family History: family history includes Diabetes in her paternal grandfather. Social History:  reports that she has never smoked. She has never used smokeless tobacco. She reports that she does not drink alcohol and does not use drugs.     Maternal Diabetes: No Genetic Screening: Normal Maternal Ultrasounds/Referrals: Normal Fetal Ultrasounds or other Referrals:  None Maternal Substance Abuse:  No Significant Maternal Medications:  None Significant Maternal Lab Results:  Group B Strep negative Other Comments:  None  Review of Systems History Dilation: 4 Effacement (%): 90 Station: -1 Exam by:: T Schermerhonr Blood pressure 127/81, pulse 73, temperature 98.7 F (37.1 C), temperature source Oral, resp. rate 18, height 5\' 5"  (1.651 m), weight 71.2 kg. Exam Physical Exam  Lungs CTA   CV RRR  cx as above VTX   reassuring fetal monitoring  Prenatal labs: ABO, Rh:  o+ Antibody:  neg Rubella:  Imm / varicella Imm RPR:   nr HBsAg:   neg HIV:   neg GBS:   neg  Assessment/Plan: SROM , active labor . Admit  CLE prn pt     Sonia Cunningham 04/19/2020, 5:41 AM

## 2020-04-19 NOTE — Discharge Instructions (Signed)
Postpartum Care After Vaginal Delivery This sheet gives you information about how to care for yourself from the time you deliver your baby to up to 6-12 weeks after delivery (postpartum period). Your health care provider may also give you more specific instructions. If you have problems or questions, contact your health care provider. Follow these instructions at home: Vaginal bleeding  It is normal to have vaginal bleeding (lochia) after delivery. Wear a sanitary pad for vaginal bleeding and discharge. ? During the first week after delivery, the amount and appearance of lochia is often similar to a menstrual period. ? Over the next few weeks, it will gradually decrease to a dry, yellow-brown discharge. ? For most women, lochia stops completely by 4-6 weeks after delivery. Vaginal bleeding can vary from woman to woman.  Change your sanitary pads frequently. Watch for any changes in your flow, such as: ? A sudden increase in volume. ? A change in color. ? Large blood clots.  If you pass a blood clot from your vagina, save it and call your health care provider to discuss. Do not flush blood clots down the toilet before talking with your health care provider.  Do not use tampons or douches until your health care provider says this is safe.  If you are not breastfeeding, your period should return 6-8 weeks after delivery. If you are feeding your child breast milk only (exclusive breastfeeding), your period may not return until you stop breastfeeding. Perineal care  Keep the area between the vagina and the anus (perineum) clean and dry as told by your health care provider. Use medicated pads and pain-relieving sprays and creams as directed.  If you had a cut in the perineum (episiotomy) or a tear in the vagina, check the area for signs of infection until you are healed. Check for: ? More redness, swelling, or pain. ? Fluid or blood coming from the cut or tear. ? Warmth. ? Pus or a bad  smell.  You may be given a squirt bottle to use instead of wiping to clean the perineum area after you go to the bathroom. As you start healing, you may use the squirt bottle before wiping yourself. Make sure to wipe gently.  To relieve pain caused by an episiotomy, a tear in the vagina, or swollen veins in the anus (hemorrhoids), try taking a warm sitz bath 2-3 times a day. A sitz bath is a warm water bath that is taken while you are sitting down. The water should only come up to your hips and should cover your buttocks. Breast care  Within the first few days after delivery, your breasts may feel heavy, full, and uncomfortable (breast engorgement). Milk may also leak from your breasts. Your health care provider can suggest ways to help relieve the discomfort. Breast engorgement should go away within a few days.  If you are breastfeeding: ? Wear a bra that supports your breasts and fits you well. ? Keep your nipples clean and dry. Apply creams and ointments as told by your health care provider. ? You may need to use breast pads to absorb milk that leaks from your breasts. ? You may have uterine contractions every time you breastfeed for up to several weeks after delivery. Uterine contractions help your uterus return to its normal size. ? If you have any problems with breastfeeding, work with your health care provider or lactation consultant.  If you are not breastfeeding: ? Avoid touching your breasts a lot. Doing this can make   your breasts produce more milk. ? Wear a good-fitting bra and use cold packs to help with swelling. ? Do not squeeze out (express) milk. This causes you to make more milk. Intimacy and sexuality  Ask your health care provider when you can engage in sexual activity. This may depend on: ? Your risk of infection. ? How fast you are healing. ? Your comfort and desire to engage in sexual activity.  You are able to get pregnant after delivery, even if you have not had  your period. If desired, talk with your health care provider about methods of birth control (contraception). Medicines  Take over-the-counter and prescription medicines only as told by your health care provider.  If you were prescribed an antibiotic medicine, take it as told by your health care provider. Do not stop taking the antibiotic even if you start to feel better. Activity  Gradually return to your normal activities as told by your health care provider. Ask your health care provider what activities are safe for you.  Rest as much as possible. Try to rest or take a nap while your baby is sleeping. Eating and drinking   Drink enough fluid to keep your urine pale yellow.  Eat high-fiber foods every day. These may help prevent or relieve constipation. High-fiber foods include: ? Whole grain cereals and breads. ? Brown rice. ? Beans. ? Fresh fruits and vegetables.  Do not try to lose weight quickly by cutting back on calories.  Take your prenatal vitamins until your postpartum checkup or until your health care provider tells you it is okay to stop. Lifestyle  Do not use any products that contain nicotine or tobacco, such as cigarettes and e-cigarettes. If you need help quitting, ask your health care provider.  Do not drink alcohol, especially if you are breastfeeding. General instructions  Keep all follow-up visits for you and your baby as told by your health care provider. Most women visit their health care provider for a postpartum checkup within the first 3-6 weeks after delivery. Contact a health care provider if:  You feel unable to cope with the changes that your child brings to your life, and these feelings do not go away.  You feel unusually sad or worried.  Your breasts become red, painful, or hard.  You have a fever.  You have trouble holding urine or keeping urine from leaking.  You have little or no interest in activities you used to enjoy.  You have not  breastfed at all and you have not had a menstrual period for 12 weeks after delivery.  You have stopped breastfeeding and you have not had a menstrual period for 12 weeks after you stopped breastfeeding.  You have questions about caring for yourself or your baby.  You pass a blood clot from your vagina. Get help right away if:  You have chest pain.  You have difficulty breathing.  You have sudden, severe leg pain.  You have severe pain or cramping in your lower abdomen.  You bleed from your vagina so much that you fill more than one sanitary pad in one hour. Bleeding should not be heavier than your heaviest period.  You develop a severe headache.  You faint.  You have blurred vision or spots in your vision.  You have bad-smelling vaginal discharge.  You have thoughts about hurting yourself or your baby. If you ever feel like you may hurt yourself or others, or have thoughts about taking your own life, get help   right away. You can go to the nearest emergency department or call:  Your local emergency services (911 in the [place]  A suicide crisis helpline, such as the National Suicide Prevention Lifeline at 1-800-273-8255. This is open 24 hours a day. Summary  The period of time right after you deliver your newborn up to 6-12 weeks after delivery is called the postpartum period.  Gradually return to your normal activities as told by your health care provider.  Keep all follow-up visits for you and your baby as told by your health care provider. This information is not intended to replace advice given to you by your health care provider. Make sure you discuss any questions you have with your health care provider. Document Revised: 07/31/2017 Document Reviewed: 05/11/2017 Elsevier Patient Education  2020 Elsevier Inc[employer]  Postpartum Baby Blues The postpartum period begins right after the birth of a baby. During this time, there is often a lot of joy and excitement. It is also a  time of many changes in the life of the parents. No matter how many times a mother gives birth, each child brings new challenges to the family, including different ways of relating to one another. It is common to have feelings of excitement along with confusing changes in moods, emotions, and thoughts. You may feel happy one minute and sad or stressed the next. These feelings of sadness usually happen in the period right after you have your baby, and they go away within a week or two. This is called the "baby blues." What are the causes? There is no known cause of baby blues. It is likely caused by a combination of factors. However, changes in hormone levels after childbirth are believed to trigger some of the symptoms. Other factors that can play a role in these mood changes include:  Lack of sleep.  Stressful life events, such as poverty, caring for a loved one, or death of a loved one.  Genetics. What are the signs or symptoms? Symptoms of this condition include:  Brief changes in mood, such as going from extreme happiness to sadness.  Decreased concentration.  Difficulty sleeping.  Crying spells and tearfulness.  Loss of appetite.  Irritability.  Anxiety. If the symptoms of baby blues last for more than 2 weeks or become more severe, you may have postpartum depression. How is this diagnosed? This condition is diagnosed based on an evaluation of your symptoms. There are no medical or lab tests that lead to a diagnosis, but there are various questionnaires that a health care provider may use to identify women with the baby blues or postpartum depression. How is this treated? Treatment is not needed for this condition. The baby blues usually go away on their own in 1-2 weeks. Social support is often all that is needed. You will be encouraged to get adequate sleep and rest. Follow these instructions at home: Lifestyle      Get as much rest as you can. Take a nap when the baby  sleeps.  Exercise regularly as told by your health care provider. Some women find yoga and walking to be helpful.  Eat a balanced and nourishing diet. This includes plenty of fruits and vegetables, whole grains, and lean proteins.  Do little things that you enjoy. Have a cup of tea, take a bubble bath, read your favorite magazine, or listen to your favorite music.  Avoid alcohol.  Ask for help with household chores, cooking, grocery shopping, or running errands. Do not try to   do everything yourself. Consider hiring a postpartum doula to help. This is a professional who specializes in providing support to new mothers.  Try not to make any major life changes during pregnancy or right after giving birth. This can add stress. General instructions  Talk to people close to you about how you are feeling. Get support from your partner, family members, friends, or other new moms. You may want to join a support group.  Find ways to cope with stress. This may include: ? Writing your thoughts and feelings in a journal. ? Spending time outside. ? Spending time with people who make you laugh.  Try to stay positive in how you think. Think about the things you are grateful for.  Take over-the-counter and prescription medicines only as told by your health care provider.  Let your health care provider know if you have any concerns.  Keep all postpartum visits as told by your health care provider. This is important. Contact a health care provider if:  Your baby blues do not go away after 2 weeks. Get help right away if:  You have thoughts of taking your own life (suicidal thoughts).  You think you may harm the baby or other people.  You see or hear things that are not there (hallucinations). Summary  After giving birth, you may feel happy one minute and sad or stressed the next. Feelings of sadness that happen right after the baby is born and go away after a week or two are called the "baby  blues."  You can manage the baby blues by getting enough rest, eating a healthy diet, exercising, spending time with supportive people, and finding ways to cope with stress.  If feelings of sadness and stress last longer than 2 weeks or get in the way of caring for your baby, talk to your health care provider. This may mean you have postpartum depression. This information is not intended to replace advice given to you by your health care provider. Make sure you discuss any questions you have with your health care provider. Document Revised: 11/19/2018 Document Reviewed: 09/23/2016 Elsevier Patient Education  2020 Elsevier Inc.  

## 2020-04-20 ENCOUNTER — Ambulatory Visit: Payer: Self-pay

## 2020-04-20 LAB — CBC
HCT: 30.3 % — ABNORMAL LOW (ref 36.0–46.0)
Hemoglobin: 10.2 g/dL — ABNORMAL LOW (ref 12.0–15.0)
MCH: 27.1 pg (ref 26.0–34.0)
MCHC: 33.7 g/dL (ref 30.0–36.0)
MCV: 80.6 fL (ref 80.0–100.0)
Platelets: 268 10*3/uL (ref 150–400)
RBC: 3.76 MIL/uL — ABNORMAL LOW (ref 3.87–5.11)
RDW: 16.4 % — ABNORMAL HIGH (ref 11.5–15.5)
WBC: 14.7 10*3/uL — ABNORMAL HIGH (ref 4.0–10.5)
nRBC: 0 % (ref 0.0–0.2)

## 2020-04-20 MED ORDER — ACETAMINOPHEN 325 MG PO TABS: 650.0000 mg | ORAL_TABLET | ORAL | Status: DC | PRN

## 2020-04-20 MED ORDER — IBUPROFEN 600 MG PO TABS: 600.0000 mg | ORAL_TABLET | Freq: Four times a day (QID) | ORAL | 2 refills | Status: DC | PRN

## 2020-04-20 NOTE — Anesthesia Postprocedure Evaluation (Signed)
Anesthesia Post Note  Patient: Marc Sivertsen  Procedure(s) Performed: AN AD HOC LABOR EPIDURAL  Patient location during evaluation: Mother Baby Anesthesia Type: Epidural Level of consciousness: awake and alert Pain management: pain level controlled Vital Signs Assessment: post-procedure vital signs reviewed and stable Respiratory status: spontaneous breathing, nonlabored ventilation and respiratory function stable Cardiovascular status: stable Postop Assessment: no headache, no backache and epidural receding Anesthetic complications: no   No complications documented.   Last Vitals:  Vitals:   04/20/20 0301 04/20/20 0802  BP: 107/64 114/76  Pulse: 71 62  Resp: 18 18  Temp: 36.9 C 36.9 C  SpO2: 94% 99%    Last Pain:  Vitals:   04/20/20 0802  TempSrc: Oral  PainSc:                  Rosanne Gutting

## 2020-04-20 NOTE — Lactation Note (Signed)
This note was copied from a baby's chart. Lactation Consultation Note  Patient Name: Sonia Cunningham TAVWP'V Date: 04/20/2020 Reason for consult: Follow-up assessment Mom and baby for discharge  Maternal Data Formula Feeding for Exclusion: No Does the patient have breastfeeding experience prior to this delivery?: Yes  Feeding  Mom states baby has been latching and nursing well, mom and baby are for discharge this pm  LATCH Score Latch:  (did not observe a feeding)                 Interventions  LC name and phone no written on white board  Lactation Tools Discussed/Used     Consult Status Consult Status: PRN    Dyann Kief 04/20/2020, 6:14 PM

## 2020-04-20 NOTE — Progress Notes (Signed)
Patient discharged home with infant. Discharge instructions, prescriptions and follow up appointment given to and reviewed with patient. Patient verbalized understanding. Pt wheeled out with infant by auxiliary.  

## 2020-08-11 NOTE — L&D Delivery Note (Signed)
Delivery Note  Sonia Cunningham is a [id number] at [redacted]w[redacted]d with an unknown LMP (breastfeeding at time of conception) dated by midtrimester [place] at [redacted]w[redacted]d.   First Stage: Labor onset: 0000 Augmentation: AROM Analgesia /Anesthesia intrapartum: Epidural AROM at 0402  Second Stage: Complete dilation at 0432 Onset of pushing at 0433 FHR second stage 130 bpm with moderate variability   Sonia Cunningham presented to L&D in active labor.  She received an epidural and was 9/100/0.  AROM performed for large amount of clear fluid.  RN was at bedside attempting to reposition the monitor when Sonia Cunningham reported she felt pressure.  RN adjusted the peanut ball and infant's head was crowning.  Delivery assistance was called to room.  RN skillfully guided shoulders and body for a quick spontaneous birth. CNM was immediately outside the room and infant was crying with good tone upon entering the delivery room.  Delivery of a viable baby girl on 06/11/2021 at 0433 by RN with CNM at bedside.  Baby placed on mom's chest, and attended to by baby RN Cord double clamped after cessation of pulsation, cut by father of baby.  Cord blood sample collection: Yes O POS  Third Stage: Oxytocin bolus started after delivery of infant for hemorrhage prophylaxis  Placenta delivered intact with 3 VC @ 0442 Placenta disposition: discarded  Uterine tone firm / bleeding moderate  No laceration identified  Anesthesia for repair: N/A Repair: none Est. Blood Loss (mL):   Complications: precipitous delivery - nurse controlled   Mom to postpartum.  Baby to Couplet care / Skin to Skin.  Newborn: Information for the patient's newborn:  Sonia Cunningham, Sonia Cunningham [[id number]]  Live born female  Birth Weight: 7 lb 14.3 oz (3580 g) APGAR: 8, 9  Newborn Delivery   Birth date/time: 06/12/2021 04:33:00 Delivery type: Vaginal, Spontaneous     Feeding planned: Breast   ---------- Margaretmary Eddy, CNM Certified Nurse Midwife [facility]

## 2020-08-27 ENCOUNTER — Ambulatory Visit: Payer: Medicaid Other

## 2020-09-03 ENCOUNTER — Ambulatory Visit: Payer: Medicaid Other | Attending: Internal Medicine

## 2020-09-03 DIAGNOSIS — Z23 Encounter for immunization: Secondary | ICD-10-CM

## 2020-09-03 NOTE — Progress Notes (Signed)
   Covid-19 Vaccination Clinic  Name:  Sonia Cunningham    MRN: [id number] DOB: [birth year]  09/03/2020  [name] was observed post Covid-19 immunization for 15 minutes without incident. She was provided with Vaccine Information Sheet and instruction to access the V-Safe system.   [name] was instructed to call 911 with any severe reactions post vaccine: Marland Kitchen Difficulty breathing  . Swelling of face and throat  . A fast heartbeat  . A bad rash all over body  . Dizziness and weakness   Immunizations Administered    Name Date Dose VIS Date Route   PFIZER Comrnaty(Gray TOP) Covid-19 Vaccine 09/03/2020  2:04 PM 0.3 mL 07/19/2020 Intramuscular   Manufacturer: [employer], [employer]   Lot: [id number]   NDC: [phone]

## 2020-09-24 ENCOUNTER — Ambulatory Visit: Payer: Medicaid Other | Attending: Internal Medicine

## 2020-09-24 DIAGNOSIS — Z23 Encounter for immunization: Secondary | ICD-10-CM

## 2020-09-24 NOTE — Progress Notes (Signed)
   Covid-19 Vaccination Clinic  Name:  Sonia Cunningham    MRN: [id number] DOB: 1985/12/02  09/24/2020  [name] was observed post Covid-19 immunization for 15 minutes without incident. She was provided with Vaccine Information Sheet and instruction to access the V-Safe system.   [name] was instructed to call 911 with any severe reactions post vaccine: Marland Kitchen Difficulty breathing  . Swelling of face and throat  . A fast heartbeat  . A bad rash all over body  . Dizziness and weakness   Immunizations Administered    Name Date Dose VIS Date Route   PFIZER Comrnaty(Gray TOP) Covid-19 Vaccine 09/24/2020  1:59 PM 0.3 mL 07/19/2020 Intramuscular   Manufacturer: [employer], [employer]   Lot: [id number]   NDC: [phone]

## 2021-01-24 DIAGNOSIS — Z3483 Encounter for supervision of other normal pregnancy, third trimester: Secondary | ICD-10-CM | POA: Insufficient documentation

## 2021-01-24 LAB — OB RESULTS CONSOLE RUBELLA ANTIBODY, IGM: Rubella: IMMUNE

## 2021-01-24 LAB — OB RESULTS CONSOLE VARICELLA ZOSTER ANTIBODY, IGG: Varicella: IMMUNE

## 2021-04-29 ENCOUNTER — Other Ambulatory Visit: Payer: Self-pay | Admitting: Certified Nurse Midwife

## 2021-04-29 DIAGNOSIS — D509 Iron deficiency anemia, unspecified: Secondary | ICD-10-CM

## 2021-04-29 DIAGNOSIS — O99019 Anemia complicating pregnancy, unspecified trimester: Secondary | ICD-10-CM

## 2021-04-29 NOTE — Progress Notes (Signed)
Iron infusions, 300mg  weekly x 3 doses.

## 2021-05-01 ENCOUNTER — Other Ambulatory Visit: Payer: Self-pay

## 2021-05-01 ENCOUNTER — Ambulatory Visit
Admission: RE | Admit: 2021-05-01 | Discharge: 2021-05-01 | Disposition: A | Discharge status: Home / Self Care | Payer: Medicaid Other | Source: Ambulatory Visit | Attending: Certified Nurse Midwife | Admitting: Certified Nurse Midwife

## 2021-05-01 DIAGNOSIS — Z3A36 36 weeks gestation of pregnancy: Secondary | ICD-10-CM | POA: Insufficient documentation

## 2021-05-01 DIAGNOSIS — D509 Iron deficiency anemia, unspecified: Secondary | ICD-10-CM | POA: Insufficient documentation

## 2021-05-01 DIAGNOSIS — O99013 Anemia complicating pregnancy, third trimester: Secondary | ICD-10-CM | POA: Insufficient documentation

## 2021-05-01 MED ORDER — SODIUM CHLORIDE 0.9 % IV SOLN
300.0000 mg | INTRAVENOUS | Status: DC
  Administered 2021-05-01: 300 mg via INTRAVENOUS
  Filled 2021-05-01: qty 300

## 2021-05-01 MED ORDER — SODIUM CHLORIDE 0.9 % IV SOLN: INTRAVENOUS | Status: DC | PRN

## 2021-05-04 ENCOUNTER — Other Ambulatory Visit: Payer: Self-pay

## 2021-05-04 ENCOUNTER — Encounter: Payer: Self-pay | Admitting: Obstetrics and Gynecology

## 2021-05-04 ENCOUNTER — Observation Stay
Admission: EM | Admit: 2021-05-04 | Discharge: 2021-05-04 | Disposition: A | Discharge status: Home / Self Care | Payer: Medicaid Other | Attending: Obstetrics | Admitting: Obstetrics

## 2021-05-04 DIAGNOSIS — O9A213 Injury, poisoning and certain other consequences of external causes complicating pregnancy, third trimester: Secondary | ICD-10-CM | POA: Diagnosis present

## 2021-05-04 DIAGNOSIS — W19XXXA Unspecified fall, initial encounter: Secondary | ICD-10-CM | POA: Diagnosis not present

## 2021-05-04 DIAGNOSIS — Z79899 Other long term (current) drug therapy: Secondary | ICD-10-CM | POA: Diagnosis not present

## 2021-05-04 DIAGNOSIS — S3992XA Unspecified injury of lower back, initial encounter: Secondary | ICD-10-CM | POA: Diagnosis not present

## 2021-05-04 DIAGNOSIS — Z3A36 36 weeks gestation of pregnancy: Secondary | ICD-10-CM | POA: Insufficient documentation

## 2021-05-04 NOTE — OB Triage Note (Signed)
Pt is a G4P3 presents after a fall about 2030 to her back and left side after stepping over a baby gate. Pt states she did not hit her stomach but felt decreased movement after fall. Pt states all pregnancies have been uncomplicated.

## 2021-05-05 NOTE — Discharge Summary (Signed)
Sonia Cunningham is a 35 y.o. female. She is at [redacted]w[redacted]d gestation. No LMP recorded. Patient is pregnant. Estimated Date of Delivery: 06/01/21    Prenatal care site: [facility]   Chief Complaint: S/P fall on coccyx and back after stepping over baby gate, denies hitting her stomach, but admits to decreased fetal movement.   S: Resting comfortably. no CTX, no VB.no LOF,  Active fetal movement.    Maternal Medical History:  Past Medical Hx:  has no past medical history on file.  Past Surgical Hx:  has a past surgical history that includes Appendectomy and Cholecystectomy.   No Known Allergies  Prior to Admission medications   Medication Sig Start Date End Date Taking? Authorizing Provider  acetaminophen (TYLENOL) 325 MG tablet Take 2 tablets (650 mg total) by mouth every 4 (four) hours as needed (for pain scale < 4). 04/20/20   Gustavo Lah, CNM  albuterol (VENTOLIN HFA) 108 (90 Base) MCG/ACT inhaler Inhale 2 puffs into the lungs every 4 (four) hours as needed for wheezing or shortness of breath. Patient not taking: No sig reported 01/25/20   Irean Hong, MD  ibuprofen (ADVIL) 600 MG tablet Take 1 tablet (600 mg total) by mouth every 6 (six) hours as needed for mild pain or moderate pain. Patient not taking: Reported on 05/01/2021 04/20/20   Gustavo Lah, CNM  Prenatal Vit-Fe Fumarate-FA (PRENATAL MULTIVITAMIN) TABS tablet Take 1 tablet by mouth daily at 12 noon.    [provider]     Social History: She  reports that she has never smoked. She has never used smokeless tobacco. She reports that she does not drink alcohol and does not use drugs.  Family History: family history includes Diabetes in her paternal grandfather.  no history of gyn cancers  Review of Systems: A full review of systems was performed and negative except as noted in the HPI.     O:  BP 112/75   Pulse 92   Temp 98.9 F (37.2 C) (Oral)   Resp 16   Ht 5\' 5"  (1.651 m)   Wt 70.8 kg   BMI  25.96 kg/m  No results found for this or any previous visit (from the past 48 hour(s)).   Constitutional: NAD, AAOx3  HE/ENT: extraocular movements grossly intact, moist mucous membranes CV: RRR PULM: nl respiratory effort, CTABL     Abd: gravid, non-tender, non-distended, soft      Ext: Non-tender, Nonedmeatous   Psych: mood appropriate, speech normal Pelvic: deferred   NST: Baseline: 135 Variability: moderate Accelerations present x >2 Decelerations absent Time   A/P: 35 y.o. [redacted]w[redacted]d with high risk pregnancy and antepartum surveillance.  Labor: not present.  Fetal Wellbeing: Reassuring Cat 1 tracing. Reactive NST  D/c home stable, precautions reviewed, follow-up as scheduled.   ----- [redacted]w[redacted]d, CNM Certified Nurse Midwife [facility]

## 2021-05-08 ENCOUNTER — Other Ambulatory Visit: Payer: Self-pay

## 2021-05-08 ENCOUNTER — Ambulatory Visit
Admission: RE | Admit: 2021-05-08 | Discharge: 2021-05-08 | Disposition: A | Discharge status: Home / Self Care | Payer: Medicaid Other | Source: Ambulatory Visit | Attending: Certified Nurse Midwife | Admitting: Certified Nurse Midwife

## 2021-05-08 DIAGNOSIS — D509 Iron deficiency anemia, unspecified: Secondary | ICD-10-CM | POA: Insufficient documentation

## 2021-05-08 DIAGNOSIS — O99019 Anemia complicating pregnancy, unspecified trimester: Secondary | ICD-10-CM | POA: Insufficient documentation

## 2021-05-08 DIAGNOSIS — Z3A00 Weeks of gestation of pregnancy not specified: Secondary | ICD-10-CM | POA: Insufficient documentation

## 2021-05-08 MED ORDER — SODIUM CHLORIDE 0.9 % IV SOLN
300.0000 mg | Freq: Once | INTRAVENOUS | Status: AC
  Administered 2021-05-08: 300 mg via INTRAVENOUS
  Filled 2021-05-08: qty 300

## 2021-05-13 LAB — OB RESULTS CONSOLE GC/CHLAMYDIA
Chlamydia: NEGATIVE
Gonorrhea: NEGATIVE

## 2021-05-13 LAB — OB RESULTS CONSOLE GBS: GBS: NEGATIVE

## 2021-05-13 LAB — OB RESULTS CONSOLE RPR: RPR: NONREACTIVE

## 2021-05-13 LAB — OB RESULTS CONSOLE HIV ANTIBODY (ROUTINE TESTING): HIV: NONREACTIVE

## 2021-05-13 LAB — OB RESULTS CONSOLE HEPATITIS B SURFACE ANTIGEN: Hepatitis B Surface Ag: NEGATIVE

## 2021-05-15 ENCOUNTER — Ambulatory Visit: Payer: Medicaid Other

## 2021-05-15 ENCOUNTER — Ambulatory Visit
Admission: RE | Admit: 2021-05-15 | Discharge: 2021-05-15 | Disposition: A | Discharge status: Home / Self Care | Payer: Medicaid Other | Source: Ambulatory Visit | Attending: Certified Nurse Midwife | Admitting: Certified Nurse Midwife

## 2021-05-15 ENCOUNTER — Other Ambulatory Visit: Payer: Self-pay

## 2021-05-15 DIAGNOSIS — O99019 Anemia complicating pregnancy, unspecified trimester: Secondary | ICD-10-CM | POA: Insufficient documentation

## 2021-05-15 DIAGNOSIS — D509 Iron deficiency anemia, unspecified: Secondary | ICD-10-CM | POA: Insufficient documentation

## 2021-05-15 MED ORDER — SODIUM CHLORIDE 0.9 % IV SOLN
300.0000 mg | Freq: Once | INTRAVENOUS | Status: AC
  Administered 2021-05-15: 300 mg via INTRAVENOUS
  Filled 2021-05-15: qty 300

## 2021-06-09 ENCOUNTER — Other Ambulatory Visit: Payer: Self-pay

## 2021-06-09 ENCOUNTER — Observation Stay
Admission: EM | Admit: 2021-06-09 | Discharge: 2021-06-09 | Disposition: A | Discharge status: Home / Self Care | Payer: Medicaid Other | Attending: Obstetrics and Gynecology | Admitting: Obstetrics and Gynecology

## 2021-06-09 ENCOUNTER — Encounter: Payer: Self-pay | Admitting: Obstetrics and Gynecology

## 2021-06-09 DIAGNOSIS — O99013 Anemia complicating pregnancy, third trimester: Secondary | ICD-10-CM | POA: Insufficient documentation

## 2021-06-09 DIAGNOSIS — Z3A40 40 weeks gestation of pregnancy: Secondary | ICD-10-CM | POA: Insufficient documentation

## 2021-06-09 DIAGNOSIS — O471 False labor at or after 37 completed weeks of gestation: Secondary | ICD-10-CM | POA: Diagnosis not present

## 2021-06-09 DIAGNOSIS — D509 Iron deficiency anemia, unspecified: Secondary | ICD-10-CM | POA: Insufficient documentation

## 2021-06-09 NOTE — OB Triage Note (Signed)
Pt arrives to LnD with complaints of ctx's approximately 5 minutes apart. Pt states that pain is only in her back at this time. Pt denies LOF or bloody show at this time but does state that her membranes were swept at the office this past week.

## 2021-06-09 NOTE — Discharge Summary (Signed)
Sonia Cunningham is a 35 y.o. female. She is at [redacted]w[redacted]d gestation. No LMP recorded. Patient is pregnant. Estimated Date of Delivery: 06/09/21  Prenatal care site: [facility]   Current pregnancy complicated by:  Iron deficiency anemia, s/p iron infusions Desires BTL  Chief complaint: irregular UCs, mostly in her back. Off and on bloody show since membrane sweep on Monday 10/24. Active FM, no LOF.    S: Resting comfortably. no VB.no LOF,  Active fetal movement. Denies: HA, visual changes, SOB, or RUQ/epigastric pain  Maternal Medical History:  History reviewed. No pertinent past medical history.  Past Surgical History:  Procedure Laterality Date   APPENDECTOMY     CHOLECYSTECTOMY      No Known Allergies  Prior to Admission medications   Medication Sig Start Date End Date Taking? Authorizing Provider  acetaminophen (TYLENOL) 325 MG tablet Take 2 tablets (650 mg total) by mouth every 4 (four) hours as needed (for pain scale < 4). Patient not taking: Reported on 05/15/2021 04/20/20   Gustavo Lah, CNM  albuterol (VENTOLIN HFA) 108 (90 Base) MCG/ACT inhaler Inhale 2 puffs into the lungs every 4 (four) hours as needed for wheezing or shortness of breath. Patient not taking: No sig reported 01/25/20   Irean Hong, MD  ibuprofen (ADVIL) 600 MG tablet Take 1 tablet (600 mg total) by mouth every 6 (six) hours as needed for mild pain or moderate pain. Patient not taking: No sig reported 04/20/20   Gustavo Lah, CNM  Prenatal Vit-Fe Fumarate-FA (PRENATAL MULTIVITAMIN) TABS tablet Take 1 tablet by mouth daily at 12 noon.    [provider]      Social History: She  reports that she has never smoked. She has never used smokeless tobacco. She reports that she does not drink alcohol and does not use drugs.  Family History: family history includes Diabetes in her paternal grandfather.   Review of Systems: A full review of systems was performed and negative except as noted  in the HPI.     O:  BP 106/68 (BP Location: Left Arm)   Pulse 84   Temp 98.4 F (36.9 C) (Oral)   Resp 18   Ht 5\' 5"  (1.651 m)   Wt 73 kg   BMI 26.79 kg/m  No results found for this or any previous visit (from the past 48 hour(s)).   Constitutional: NAD, AAOx3  HE/ENT: extraocular movements grossly intact, moist mucous membranes CV: RRR PULM: nl respiratory effort, CTABL     Abd: gravid, non-tender, non-distended, soft      Ext: Non-tender, Nonedematous   Psych: mood appropriate, speech normal Pelvic: 3-4/70/-1, soft/midposition   Fetal  monitoring: Cat I Appropriate for GA Baseline: 130bpm Variability: moderate Accelerations: present x >2 Decelerations absent  Toco: irregular mild UCs  A/P: 35 y.o. [redacted]w[redacted]d here for antenatal surveillance for false labor  Principle Diagnosis:  40wks, irregular UCs.   Labor: not present.  Fetal Wellbeing: Reassuring Cat 1 tracing. Reactive NST  D/c home stable, precautions reviewed, follow-up as scheduled.    [redacted]w[redacted]d, CNM 06/09/2021  4:29 AM

## 2021-06-12 ENCOUNTER — Inpatient Hospital Stay: Payer: Medicaid Other | Admitting: Anesthesiology

## 2021-06-12 ENCOUNTER — Other Ambulatory Visit: Payer: Self-pay

## 2021-06-12 ENCOUNTER — Inpatient Hospital Stay
Admission: EM | Admit: 2021-06-12 | Discharge: 2021-06-13 | DRG: 805 | Disposition: A | Discharge status: Home / Self Care | Payer: Medicaid Other | Attending: Obstetrics | Admitting: Obstetrics

## 2021-06-12 ENCOUNTER — Encounter: Payer: Self-pay | Admitting: Obstetrics and Gynecology

## 2021-06-12 DIAGNOSIS — O26893 Other specified pregnancy related conditions, third trimester: Secondary | ICD-10-CM | POA: Diagnosis present

## 2021-06-12 DIAGNOSIS — O9902 Anemia complicating childbirth: Secondary | ICD-10-CM | POA: Diagnosis present

## 2021-06-12 DIAGNOSIS — D509 Iron deficiency anemia, unspecified: Secondary | ICD-10-CM | POA: Diagnosis present

## 2021-06-12 DIAGNOSIS — O9852 Other viral diseases complicating childbirth: Secondary | ICD-10-CM | POA: Diagnosis present

## 2021-06-12 DIAGNOSIS — Z3A40 40 weeks gestation of pregnancy: Secondary | ICD-10-CM | POA: Diagnosis not present

## 2021-06-12 DIAGNOSIS — U071 COVID-19: Secondary | ICD-10-CM | POA: Diagnosis present

## 2021-06-12 LAB — TYPE AND SCREEN
ABO/RH(D): O POS
Antibody Screen: NEGATIVE

## 2021-06-12 LAB — RESP PANEL BY RT-PCR (FLU A&B, COVID) ARPGX2
Influenza A by PCR: NEGATIVE
Influenza B by PCR: NEGATIVE
SARS Coronavirus 2 by RT PCR: POSITIVE — AB

## 2021-06-12 LAB — CBC
HCT: 33 % — ABNORMAL LOW (ref 36.0–46.0)
Hemoglobin: 11.5 g/dL — ABNORMAL LOW (ref 12.0–15.0)
MCH: 30.5 pg (ref 26.0–34.0)
MCHC: 34.8 g/dL (ref 30.0–36.0)
MCV: 87.5 fL (ref 80.0–100.0)
Platelets: 221 10*3/uL (ref 150–400)
RBC: 3.77 MIL/uL — ABNORMAL LOW (ref 3.87–5.11)
RDW: 15.8 % — ABNORMAL HIGH (ref 11.5–15.5)
WBC: 11 10*3/uL — ABNORMAL HIGH (ref 4.0–10.5)
nRBC: 0 % (ref 0.0–0.2)

## 2021-06-12 MED ORDER — DOCUSATE SODIUM 100 MG PO CAPS
100.0000 mg | ORAL_CAPSULE | Freq: Two times a day (BID) | ORAL | Status: DC
  Administered 2021-06-12 – 2021-06-13 (×2): 100 mg via ORAL
  Filled 2021-06-12 (×2): qty 1

## 2021-06-12 MED ORDER — LACTATED RINGERS IV SOLN
500.0000 mL | Freq: Once | INTRAVENOUS | Status: AC
  Administered 2021-06-12: 500 mL via INTRAVENOUS

## 2021-06-12 MED ORDER — COCONUT OIL OIL
1.0000 "application " | TOPICAL_OIL | Status: DC | PRN
  Filled 2021-06-12 (×2): qty 120

## 2021-06-12 MED ORDER — ONDANSETRON HCL 4 MG PO TABS
4.0000 mg | ORAL_TABLET | ORAL | Status: DC | PRN
  Filled 2021-06-12: qty 1

## 2021-06-12 MED ORDER — SODIUM CHLORIDE 0.9 % IV SOLN
INTRAVENOUS | Status: DC | PRN
  Administered 2021-06-12: 10 mL via EPIDURAL

## 2021-06-12 MED ORDER — MISOPROSTOL 200 MCG PO TABS
ORAL_TABLET | ORAL | Status: AC
  Filled 2021-06-12: qty 4

## 2021-06-12 MED ORDER — ACETAMINOPHEN 500 MG PO TABS: 1000.0000 mg | ORAL_TABLET | Freq: Four times a day (QID) | ORAL | Status: DC | PRN

## 2021-06-12 MED ORDER — BENZOCAINE-MENTHOL 20-0.5 % EX AERO: 1.0000 "application " | INHALATION_SPRAY | CUTANEOUS | Status: DC | PRN

## 2021-06-12 MED ORDER — DIBUCAINE (PERIANAL) 1 % EX OINT: 1.0000 "application " | TOPICAL_OINTMENT | CUTANEOUS | Status: DC | PRN

## 2021-06-12 MED ORDER — FENTANYL-BUPIVACAINE-NACL 0.5-0.125-0.9 MG/250ML-% EP SOLN
12.0000 mL/h | EPIDURAL | Status: DC | PRN
  Administered 2021-06-12: 12 mL/h via EPIDURAL

## 2021-06-12 MED ORDER — WITCH HAZEL-GLYCERIN EX PADS: 1.0000 "application " | MEDICATED_PAD | CUTANEOUS | Status: DC

## 2021-06-12 MED ORDER — LIDOCAINE HCL (PF) 1 % IJ SOLN
INTRAMUSCULAR | Status: DC | PRN
  Administered 2021-06-12: 3 mL via SUBCUTANEOUS

## 2021-06-12 MED ORDER — SIMETHICONE 80 MG PO CHEW: 80.0000 mg | CHEWABLE_TABLET | ORAL | Status: DC | PRN

## 2021-06-12 MED ORDER — LIDOCAINE-EPINEPHRINE (PF) 1.5 %-1:200000 IJ SOLN
INTRAMUSCULAR | Status: DC | PRN
  Administered 2021-06-12: 3 mL via EPIDURAL

## 2021-06-12 MED ORDER — ONDANSETRON HCL 4 MG/2ML IJ SOLN
4.0000 mg | Freq: Four times a day (QID) | INTRAMUSCULAR | Status: DC | PRN
  Administered 2021-06-12: 4 mg via INTRAVENOUS
  Filled 2021-06-12: qty 2

## 2021-06-12 MED ORDER — FENTANYL CITRATE (PF) 100 MCG/2ML IJ SOLN: 50.0000 ug | INTRAMUSCULAR | Status: DC | PRN

## 2021-06-12 MED ORDER — OXYTOCIN BOLUS FROM INFUSION
333.0000 mL | Freq: Once | INTRAVENOUS | Status: AC
  Administered 2021-06-12: 333 mL via INTRAVENOUS

## 2021-06-12 MED ORDER — LIDOCAINE HCL (PF) 1 % IJ SOLN
30.0000 mL | INTRAMUSCULAR | Status: DC | PRN
  Filled 2021-06-12: qty 30

## 2021-06-12 MED ORDER — SOD CITRATE-CITRIC ACID 500-334 MG/5ML PO SOLN: 30.0000 mL | ORAL | Status: DC | PRN

## 2021-06-12 MED ORDER — PRENATAL MULTIVITAMIN CH
1.0000 | ORAL_TABLET | Freq: Every day | ORAL | Status: DC
  Administered 2021-06-12 – 2021-06-13 (×2): 1 via ORAL
  Filled 2021-06-12 (×2): qty 1

## 2021-06-12 MED ORDER — OXYTOCIN 10 UNIT/ML IJ SOLN
INTRAMUSCULAR | Status: AC
  Filled 2021-06-12: qty 2

## 2021-06-12 MED ORDER — PHENYLEPHRINE 40 MCG/ML (10ML) SYRINGE FOR IV PUSH (FOR BLOOD PRESSURE SUPPORT): 80.0000 ug | PREFILLED_SYRINGE | INTRAVENOUS | Status: DC | PRN

## 2021-06-12 MED ORDER — EPHEDRINE 5 MG/ML INJ: 10.0000 mg | INTRAVENOUS | Status: DC | PRN

## 2021-06-12 MED ORDER — DIPHENHYDRAMINE HCL 50 MG/ML IJ SOLN: 12.5000 mg | INTRAMUSCULAR | Status: DC | PRN

## 2021-06-12 MED ORDER — OXYTOCIN-SODIUM CHLORIDE 30-0.9 UT/500ML-% IV SOLN
2.5000 [IU]/h | INTRAVENOUS | Status: DC
  Filled 2021-06-12: qty 500

## 2021-06-12 MED ORDER — LACTATED RINGERS IV SOLN: INTRAVENOUS | Status: DC

## 2021-06-12 MED ORDER — ONDANSETRON HCL 4 MG/2ML IJ SOLN: 4.0000 mg | INTRAMUSCULAR | Status: DC | PRN

## 2021-06-12 MED ORDER — FENTANYL-BUPIVACAINE-NACL 0.5-0.125-0.9 MG/250ML-% EP SOLN
EPIDURAL | Status: AC
  Filled 2021-06-12: qty 250

## 2021-06-12 MED ORDER — LACTATED RINGERS IV SOLN: 500.0000 mL | INTRAVENOUS | Status: DC | PRN

## 2021-06-12 MED ORDER — DIPHENHYDRAMINE HCL 25 MG PO CAPS: 25.0000 mg | ORAL_CAPSULE | Freq: Four times a day (QID) | ORAL | Status: DC | PRN

## 2021-06-12 MED ORDER — IBUPROFEN 600 MG PO TABS
600.0000 mg | ORAL_TABLET | Freq: Four times a day (QID) | ORAL | Status: DC
  Administered 2021-06-12 – 2021-06-13 (×4): 600 mg via ORAL
  Filled 2021-06-12 (×5): qty 1

## 2021-06-12 MED ORDER — AMMONIA AROMATIC IN INHA
RESPIRATORY_TRACT | Status: AC
  Filled 2021-06-12: qty 10

## 2021-06-12 NOTE — H&P (Signed)
OB History & Physical   History of Present Illness:   Chief Complaint: painful contractions   HPI:  Sonia Cunningham is a 35 y.o. G87P3003 female at [redacted]w[redacted]d dated by midtrimester [place] at [redacted]w[redacted]d, unknown LMP.  She presents to L&D for painful contractions.  States contractions that have been 8-10 minutes all day and have gotten progressively more intense over the past couple of hours. Reports blood show, denies LOF, endorses good fetal movement.   Reports active fetal movement  Contractions: every 5 to 8 minutes LOF/SROM: denies  Vaginal bleeding: bloody show   Factors complicating pregnancy:  Late entry into prenatal care  Maternal iron deficiency anemia  Close interval pregnancies  Desires BTL - consent signed 05/20/2021  Patient Active Problem List   Diagnosis Date Noted   Normal labor 06/12/2021   False labor after 37 weeks of gestation without delivery 06/09/2021   Fall 05/04/2021   NSVD (normal spontaneous vaginal delivery) 04/19/2020     Maternal Medical History:  History reviewed. No pertinent past medical history.  Past Surgical History:  Procedure Laterality Date   APPENDECTOMY     CHOLECYSTECTOMY      No Known Allergies  Prior to Admission medications   Medication Sig Start Date End Date Taking? Authorizing Provider  Prenatal Vit-Fe Fumarate-FA (PRENATAL MULTIVITAMIN) TABS tablet Take 1 tablet by mouth daily at 12 noon.   Yes [provider]  acetaminophen (TYLENOL) 325 MG tablet Take 2 tablets (650 mg total) by mouth every 4 (four) hours as needed (for pain scale < 4). Patient not taking: Reported on 05/15/2021 04/20/20   Minda Meo, CNM  albuterol (VENTOLIN HFA) 108 (90 Base) MCG/ACT inhaler Inhale 2 puffs into the lungs every 4 (four) hours as needed for wheezing or shortness of breath. Patient not taking: No sig reported 01/25/20   Paulette Blanch, MD  ibuprofen (ADVIL) 600 MG tablet Take 1 tablet (600 mg total) by mouth every 6 (six) hours as needed for  mild pain or moderate pain. Patient not taking: No sig reported 04/20/20   Minda Meo, CNM     Prenatal care site:  [facility]  Social History: She  reports that she has never smoked. She has never used smokeless tobacco. She reports that she does not drink alcohol and does not use drugs.  Family History: family history includes Diabetes in her paternal grandfather.   Review of Systems: A full review of systems was performed and negative except as noted in the HPI.     Physical Exam:  Vital Signs: BP 122/78 (BP Location: Right Arm)   Pulse 72   Temp 98.6 F (37 C) (Oral)   Resp 18  Physical Exam  General: no acute distress.  HEENT: normocephalic, atraumatic Heart: regular rate & rhythm.  No murmurs/rubs/gallops Lungs: clear to auscultation bilaterally, normal respiratory effort Abdomen: soft, gravid, non-tender;  EFW: 7lbs  Pelvic:   External: Normal external female genitalia  Cervix: Dilation: 7.5 / Effacement (%): 90 / Station: -1    Extremities: non-tender, symmetric, No edema bilaterally.  DTRs: 2+/2+  Neurologic: Alert & oriented x 3.    No results found for this or any previous visit (from the past 24 hour(s)).  Pertinent Results:  Prenatal Labs: Blood type/Rh O pos  Antibody screen neg  Rubella Immune  Varicella Immune  RPR NR  HBsAg Neg  HIV NR  GC neg  Chlamydia neg  Genetic screening cfDNA negative   1 hour GTT 123  3 hour GTT N/A  GBS Neg   FHT:  FHR: 130 bpm, variability: moderate,  accelerations:  Present,  decelerations:  Absent Category/reactivity:  Category I UC:   regular, every 5-7 minutes   Cephalic by Leopolds and SVE   No results found.  Assessment:  Sonia Cunningham is a 35 y.o. G38P3003 female at [redacted]w[redacted]d with active labo4.   Plan:  1. Admit to Labor & Delivery; consents reviewed and obtained - Covid admission screen   2. Fetal Well being  - Fetal Tracing: cat 1 - Group B Streptococcus ppx not indicated: GBS neg -  Presentation: cephalic confirmed by SVE    3. Routine OB: - Prenatal labs reviewed, as above - Rh pos - CBC, T&S, RPR on admit - Clear fluids, IVF  4. Monitoring of labor  - Contractions monitored with external toco - Pelvis adequate for trial of labor  - Plan for expectant management  - Augmentation with oxytocin and AROM as appropriate  - Plan for  continuous fetal monitoring - Maternal pain control as desired; planning regional anesthesia - Anticipate vaginal delivery  5. Post Partum Planning: - Infant feeding: TBD - Contraception: BTL - consent signed 05/20/2021 - Tdap vaccine: declined  - Flu vaccine: declined   Gustavo Lah, CNM 06/12/21 1:42 AM  Margaretmary Eddy, CNM Certified Nurse Midwife [facility]

## 2021-06-12 NOTE — Anesthesia Preprocedure Evaluation (Addendum)
Anesthesia Evaluation  Patient identified by MRN, date of birth, ID band Patient awake    Reviewed: Allergy & Precautions, NPO status , Patient's Chart, lab work & pertinent test results  History of Anesthesia Complications Negative for: history of anesthetic complications  Airway Mallampati: II  TM Distance: >3 FB Neck ROM: Full    Dental no notable dental hx. (+) Teeth Intact   Pulmonary neg sleep apnea, neg COPD, Patient abstained from smoking.Not current smoker,  Incidentally COVID +   Pulmonary exam normal breath sounds clear to auscultation       Cardiovascular Exercise Tolerance: Good METS(-) hypertension(-) CAD and (-) Past MI negative cardio ROS  (-) dysrhythmias  Rhythm:Regular Rate:Normal - Systolic murmurs    Neuro/Psych negative neurological ROS  negative psych ROS   GI/Hepatic neg GERD  ,(+)     (-) substance abuse  ,   Endo/Other  neg diabetes  Renal/GU negative Renal ROS     Musculoskeletal   Abdominal   Peds  Hematology   Anesthesia Other Findings History reviewed. No pertinent past medical history.  Reproductive/Obstetrics (+) Pregnancy                            Anesthesia Physical Anesthesia Plan  ASA: 2  Anesthesia Plan: Epidural   Post-op Pain Management:    Induction:   PONV Risk Score and Plan: 2 and Treatment may vary due to age or medical condition and Ondansetron  Airway Management Planned: Natural Airway  Additional Equipment:   Intra-op Plan:   Post-operative Plan:   Informed Consent: I have reviewed the patients History and Physical, chart, labs and discussed the procedure including the risks, benefits and alternatives for the proposed anesthesia with the patient or authorized representative who has indicated his/her understanding and acceptance.       Plan Discussed with: Surgeon  Anesthesia Plan Comments: (Discussed R/B/A of  neuraxial anesthesia technique with patient: - rare risks of spinal/epidural hematoma, nerve damage, infection - Risk of PDPH - Risk of itching - Risk of nausea and vomiting - Risk of poor block necessitating replacement of epidural. - Risk of allergic reactions. Patient voiced understanding.)        Anesthesia Quick Evaluation

## 2021-06-12 NOTE — Anesthesia Procedure Notes (Signed)
Epidural Patient location during procedure: OB Start time: 06/12/2021 2:45 AM End time: 06/12/2021 3:06 AM  Staffing Anesthesiologist: Corinda Gubler, MD Performed: anesthesiologist   Preanesthetic Checklist Completed: patient identified, IV checked, site marked, risks and benefits discussed, surgical consent, monitors and equipment checked, pre-op evaluation and timeout performed  Epidural Patient position: sitting Prep: ChloraPrep Patient monitoring: heart rate, continuous pulse ox and blood pressure Approach: midline Location: L4-L5 Injection technique: LOR saline  Needle:  Needle type: Tuohy  Needle gauge: 17 G Needle length: 9 cm and 9 Needle insertion depth: 4 cm Catheter type: closed end flexible Catheter size: 19 Gauge Catheter at skin depth: 9 cm Test dose: negative and 1.5% lidocaine with Epi 1:200 K  Assessment Sensory level: T10 Events: blood not aspirated, injection not painful, no injection resistance, no paresthesia and negative IV test  Additional Notes first attempt Pt. Evaluated and documentation done after procedure finished. Patient identified. Risks/Benefits/Options discussed with patient including but not limited to bleeding, infection, nerve damage, paralysis, failed block, incomplete pain control, headache, blood pressure changes, nausea, vomiting, reactions to medication both or allergic, itching and postpartum back pain. Confirmed with bedside nurse the patient's most recent platelet count. Confirmed with patient that they are not currently taking any anticoagulation, have any bleeding history or any family history of bleeding disorders. Patient expressed understanding and wished to proceed. All questions were answered. Sterile technique was used throughout the entire procedure. Please see nursing notes for vital signs. Test dose was given through epidural catheter and negative prior to continuing to dose epidural or start infusion. Warning signs of high  block given to the patient including shortness of breath, tingling/numbness in hands, complete motor block, or any concerning symptoms with instructions to call for help. Patient was given instructions on fall risk and not to get out of bed. All questions and concerns addressed with instructions to call with any issues or inadequate analgesia.     Patient tolerated the insertion well without immediate complications.  Reason for block: procedure for painReason for block:procedure for pain

## 2021-06-12 NOTE — Discharge Summary (Signed)
Obstetrical Discharge Summary  Patient Name: Sonia Cunningham DOB: [birth year] MRN: [id number]  Date of Admission: 06/12/2021 Date of Delivery: 06/11/2021 Delivered by: Margaretmary Eddy, CNM  Date of Discharge: 06/13/2021   Primary OB: [facility] LMP:No LMP recorded (lmp unknown). EDC Estimated Date of Delivery: 06/09/21 Gestational Age at Delivery: [redacted]w[redacted]d   Antepartum complications:  Late entry into prenatal care  Maternal iron deficiency anemia  Close interval pregnancies  Desires BTL - consent signed 05/20/2021 Covid-19 positive on admission - asymptomatic   Admitting Diagnosis: Normal labor [O80, Z37.9]  Secondary Diagnosis: Patient Active Problem List   Diagnosis Date Noted   Normal labor 06/12/2021   Encounter for supervision of other normal pregnancy, third trimester 01/24/2021    Augmentation: AROM Complications: None Intrapartum complications/course: Sonia Cunningham presented to L&D in active labor.  She received an epidural and was 9/100/0.  AROM performed for large amount of clear fluid.  RN was at bedside attempting to reposition the monitor when Sonia Cunningham reported she felt pressure.  RN adjusted the peanut ball and infant's head was crowning.  Delivery assistance was called to room.  RN skillfully guided shoulders and body for a quick spontaneous birth. CNM was immediately outside the room and infant was crying with good tone upon entering the delivery room.  Delivery Type: spontaneous vaginal delivery Anesthesia: epidural Placenta: spontaneous Laceration: none Episiotomy: none Newborn Data: Live born female "Sonia Cunningham" Birth Weight: 7 lb 14.3 oz (3580 g) APGAR: 8, 9  Newborn Delivery   Birth date/time: 06/12/2021 04:33:00 Delivery type: Vaginal, Spontaneous     Postpartum Procedures:  Edinburgh:  Edinburgh Postnatal Depression Scale Screening Tool 06/12/2021 04/20/2020 04/19/2020  I have been able to laugh and see the funny side of things. 0 0 (No Data)  I have looked  forward with enjoyment to things. 0 0 -  I have blamed myself unnecessarily when things went wrong. 0 1 -  I have been anxious or worried for no good reason. 0 1 -  I have felt scared or panicky for no good reason. 0 0 -  Things have been getting on top of me. 0 0 -  I have been so unhappy that I have had difficulty sleeping. 0 0 -  I have felt sad or miserable. 0 0 -  I have been so unhappy that I have been crying. 0 0 -  The thought of harming myself has occurred to me. 0 0 -  Edinburgh Postnatal Depression Scale Total 0 2 -     Post partum course:  Patient had an uncomplicated postpartum course.  By time of discharge on PPD#1, her pain was controlled on oral pain medications; she had appropriate lochia and was ambulating, voiding without difficulty and tolerating regular diet.  She was deemed stable for discharge to home.    Discharge Physical Exam:  BP 105/71 (BP Location: Left Arm)   Pulse 66   Temp 98 F (36.7 C) (Oral)   Resp 18   Ht 5\' 5"  (1.651 m)   Wt 73 kg   LMP  (LMP Unknown)   SpO2 97%   Breastfeeding Unknown   BMI 26.79 kg/m   General: NAD CV: RRR Pulm: CTABL, nl effort ABD: s/nd/nt, fundus firm and below the umbilicus Lochia: moderate Perineum:minimal edema/intact DVT Evaluation: LE non-ttp, no evidence of DVT on exam.  Hemoglobin  Date Value Ref Range Status  06/13/2021 10.0 (L) 12.0 - 15.0 g/dL Final   HGB  Date Value Ref Range Status  05/15/2012 10.4 (  L) 12.0 - 16.0 g/dL Final   HCT  Date Value Ref Range Status  06/13/2021 31.1 (L) 36.0 - 46.0 % Final  05/17/2012 27.3 (L) 35.0 - 47.0 % Final     Disposition: stable, discharge to home. Baby Feeding: breastmilk Baby Disposition: home with mom  Rh Immune globulin given: Rh pos Rubella vaccine given: Immune Varivax vaccine given: Immune Flu vaccine given in AP or PP setting: declined Tdap vaccine given in AP or PP setting: declined  Contraception: BTL - Planned interval postpartum BTL    Prenatal Labs:  Blood type/Rh O pos  Antibody screen neg  Rubella Immune  Varicella Immune  RPR NR  HBsAg Neg  HIV NR  GC neg  Chlamydia neg  Genetic screening cfDNA negative   1 hour GTT 123  3 hour GTT N/A  GBS Neg    Plan:  Sonia Cunningham was discharged to home in good condition. Follow-up appointment with MD in 2 weeks for preop and with delivering provider in 6 weeks.  Discharge Medications: Allergies as of 06/13/2021   No Known Allergies      Medication List     TAKE these medications    acetaminophen 500 MG tablet Commonly known as: TYLENOL Take 2 tablets (1,000 mg total) by mouth every 6 (six) hours as needed (for pain scale < 4).   benzocaine-Menthol 20-0.5 % Aero Commonly known as: DERMOPLAST Apply 1 application topically as needed for irritation (perineal discomfort).   coconut oil Oil Apply 1 application topically as needed.   dibucaine 1 % Oint Commonly known as: NUPERCAINAL Place 1 application rectally as needed for hemorrhoids.   docusate sodium 100 MG capsule Commonly known as: COLACE Take 1 capsule (100 mg total) by mouth 2 (two) times daily.   ibuprofen 600 MG tablet Commonly known as: ADVIL Take 1 tablet (600 mg total) by mouth every 6 (six) hours.   prenatal multivitamin Tabs tablet Take 1 tablet by mouth daily at 12 noon.   simethicone 80 MG chewable tablet Commonly known as: MYLICON Chew 1 tablet (80 mg total) by mouth as needed for flatulence.   witch hazel-glycerin pad Commonly known as: TUCKS Apply 1 application topically continuous.         Follow-up Information     [facility]. Schedule an appointment as soon as possible for a visit in 2 week(s).   Why: preop appointment for BTL with MD Contact information: [address] [place] [address] [phone]        Gustavo Lah, CNM. Schedule an appointment as soon as possible for a visit in 6 week(s).   Specialty: Certified Nurse  Midwife Why: postpartum visit Contact information: [address] [place] [address] [phone]                 Signed: Chari Manning CNM

## 2021-06-13 LAB — CBC
HCT: 31.1 % — ABNORMAL LOW (ref 36.0–46.0)
Hemoglobin: 10 g/dL — ABNORMAL LOW (ref 12.0–15.0)
MCH: 28.9 pg (ref 26.0–34.0)
MCHC: 32.2 g/dL (ref 30.0–36.0)
MCV: 89.9 fL (ref 80.0–100.0)
Platelets: 200 10*3/uL (ref 150–400)
RBC: 3.46 MIL/uL — ABNORMAL LOW (ref 3.87–5.11)
RDW: 15.9 % — ABNORMAL HIGH (ref 11.5–15.5)
WBC: 9.2 10*3/uL (ref 4.0–10.5)
nRBC: 0 % (ref 0.0–0.2)

## 2021-06-13 MED ORDER — DIBUCAINE (PERIANAL) 1 % EX OINT: 1.0000 "application " | TOPICAL_OINTMENT | CUTANEOUS | Status: AC | PRN

## 2021-06-13 MED ORDER — COCONUT OIL OIL: 1.0000 "application " | TOPICAL_OIL | 0 refills | Status: AC | PRN

## 2021-06-13 MED ORDER — BENZOCAINE-MENTHOL 20-0.5 % EX AERO: 1.0000 "application " | INHALATION_SPRAY | CUTANEOUS | Status: AC | PRN

## 2021-06-13 MED ORDER — ACETAMINOPHEN 500 MG PO TABS: 1000.0000 mg | ORAL_TABLET | Freq: Four times a day (QID) | ORAL | 0 refills | Status: AC | PRN

## 2021-06-13 MED ORDER — DOCUSATE SODIUM 100 MG PO CAPS: 100.0000 mg | ORAL_CAPSULE | Freq: Two times a day (BID) | ORAL | 0 refills | Status: AC

## 2021-06-13 MED ORDER — SIMETHICONE 80 MG PO CHEW: 80.0000 mg | CHEWABLE_TABLET | ORAL | 0 refills | Status: AC | PRN

## 2021-06-13 MED ORDER — WITCH HAZEL-GLYCERIN EX PADS: 1.0000 "application " | MEDICATED_PAD | CUTANEOUS | 12 refills | Status: AC

## 2021-06-13 MED ORDER — IBUPROFEN 600 MG PO TABS: 600.0000 mg | ORAL_TABLET | Freq: Four times a day (QID) | ORAL | 0 refills | Status: AC

## 2021-06-13 NOTE — Discharge Instructions (Signed)

## 2021-06-13 NOTE — Anesthesia Postprocedure Evaluation (Signed)
Anesthesia Post Note  Patient: Sonia Cunningham  Procedure(s) Performed: AN AD HOC LABOR EPIDURAL  Patient location during evaluation: Mother Baby Anesthesia Type: Epidural Level of consciousness: awake and alert Pain management: pain level controlled Vital Signs Assessment: post-procedure vital signs reviewed and stable Respiratory status: spontaneous breathing, nonlabored ventilation and respiratory function stable Cardiovascular status: stable Postop Assessment: no headache, no backache and epidural receding Anesthetic complications: no   No notable events documented.   Last Vitals:  Vitals:   06/13/21 0025 06/13/21 0850  BP: 117/73 105/71  Pulse: 66 66  Resp: 18 18  Temp: 37.1 C 36.7 C  SpO2: 98% 97%    Last Pain:  Vitals:   06/13/21 0850  TempSrc: Oral  PainSc: 0-No pain                 Montrell Cessna,  Alessandra Bevels

## 2021-06-13 NOTE — Progress Notes (Signed)
Mother discharged.  Discharge instructions given.  Mother verbalizes understanding.  Transported by auxiliary.  

## 2021-11-24 IMAGING — CT CT ANGIO CHEST
2 of 6 series · 18 of 46 positions shown · IV contrast (APPLIED)
Comparison: None.

CLINICAL DATA: Shortness of breath, 27 weeks pregnant

EXAM:
CT ANGIOGRAPHY CHEST WITH CONTRAST
TECHNIQUE: Multidetector CT imaging of the chest was performed using the
standard protocol during bolus administration of intravenous
contrast. Multiplanar CT image reconstructions and MIPs were
obtained to evaluate the vascular anatomy.
CONTRAST:  100mL OMNIPAQUE IOHEXOL 350 MG/ML SOLN

[Series 5: thins · axial · 0.64mm/px · z∈[-378,-155]mm · 15 of 245 slices shown]
[im 11/245  lung]
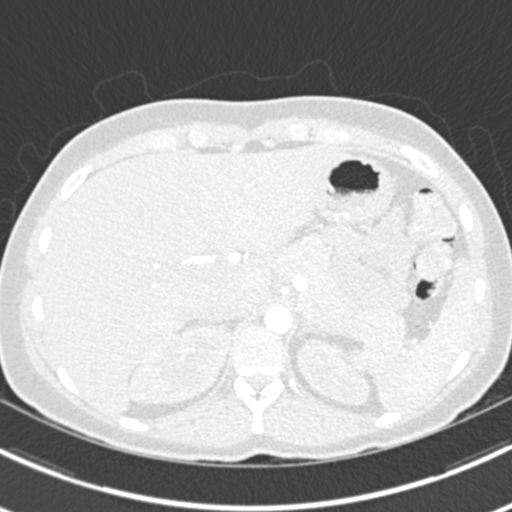
[im 32/245  soft-tissue]
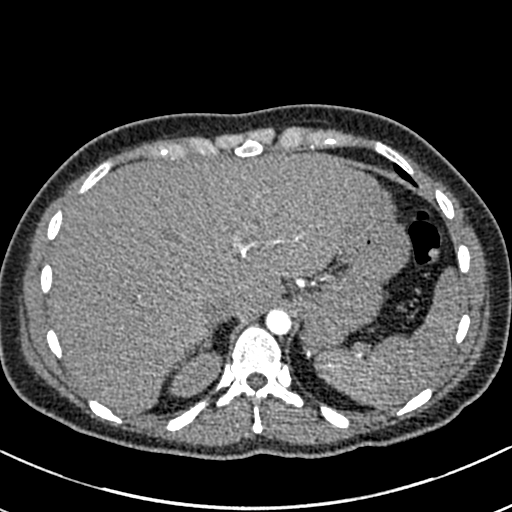
[im 43/245  lung]
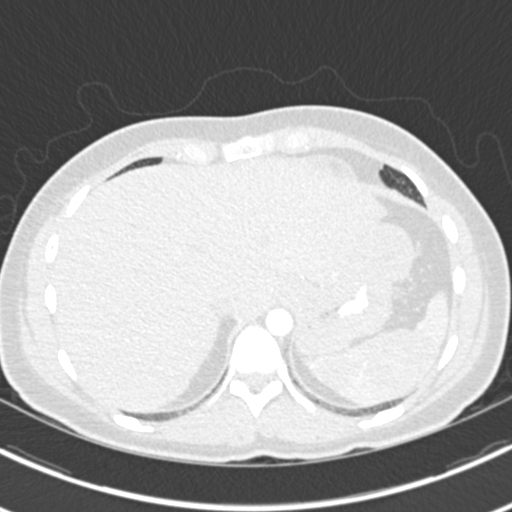
[im 64/245  soft-tissue]
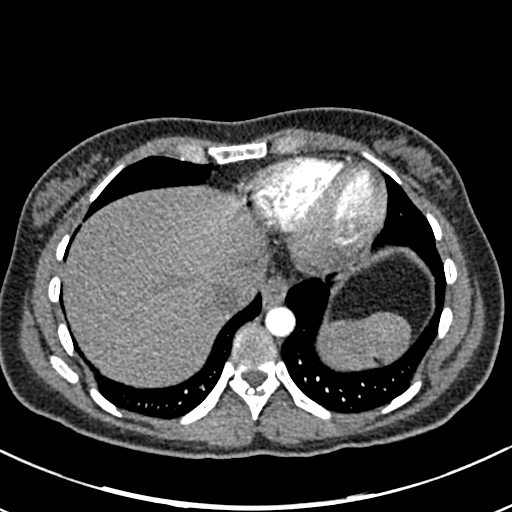
[im 75/245  lung]
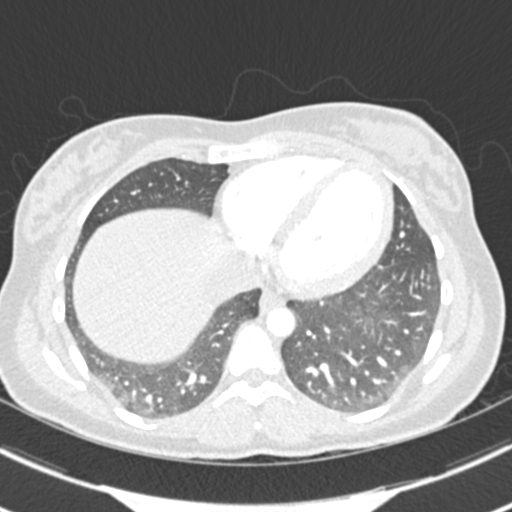
[im 96/245  soft-tissue]
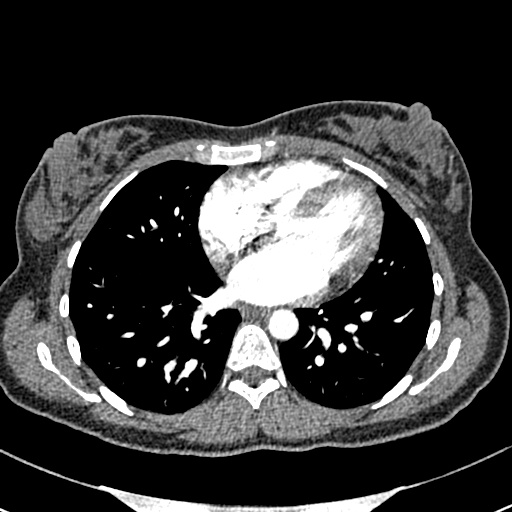
[im 107/245  lung]
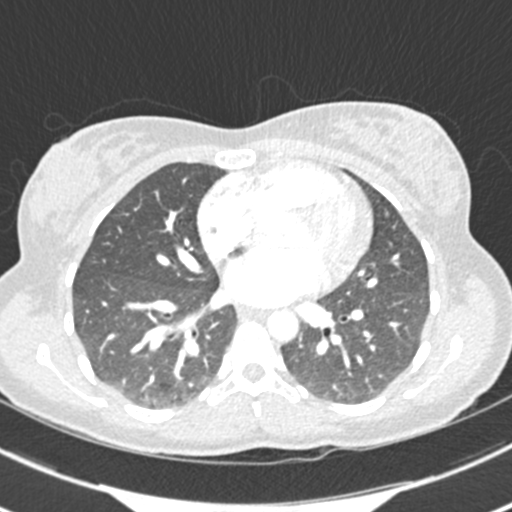
[im 128/245  soft-tissue]
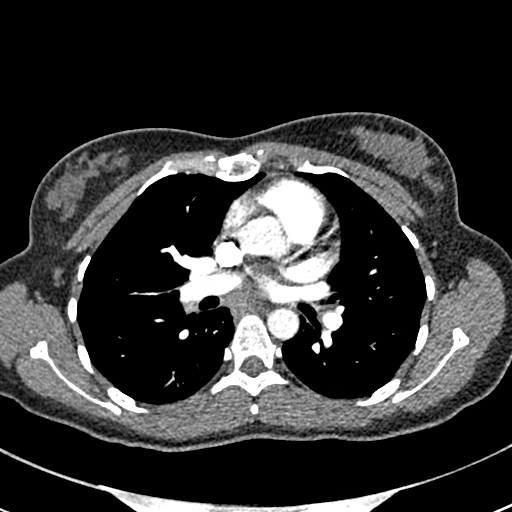
[im 138/245  lung]
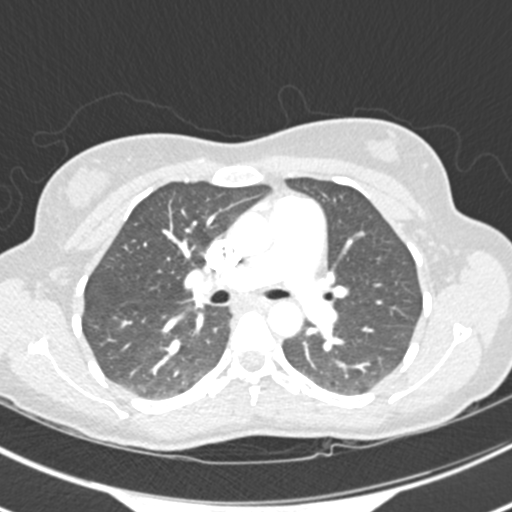
[im 149/245  soft-tissue]
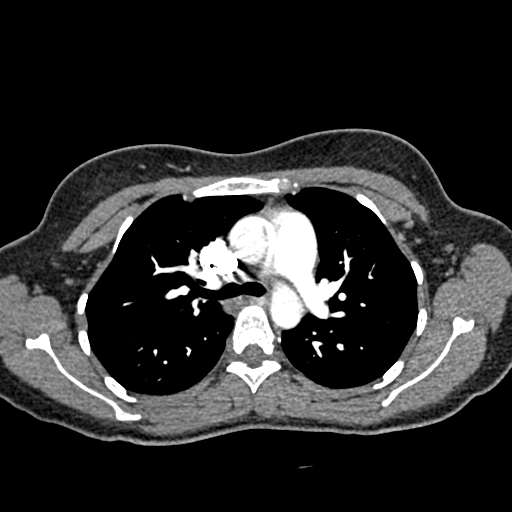
[im 170/245  lung]
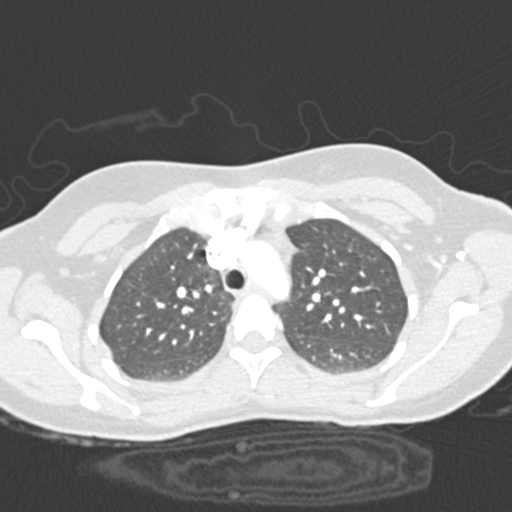
[im 181/245  soft-tissue]
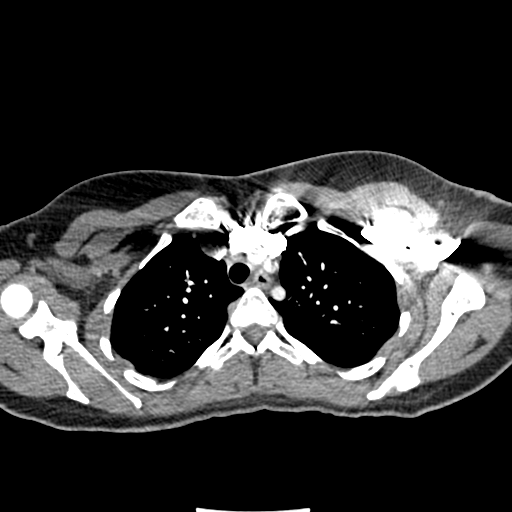
[im 202/245  lung]
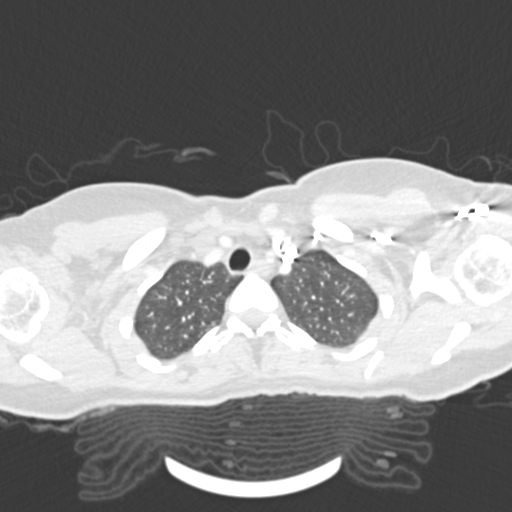
[im 213/245  soft-tissue]
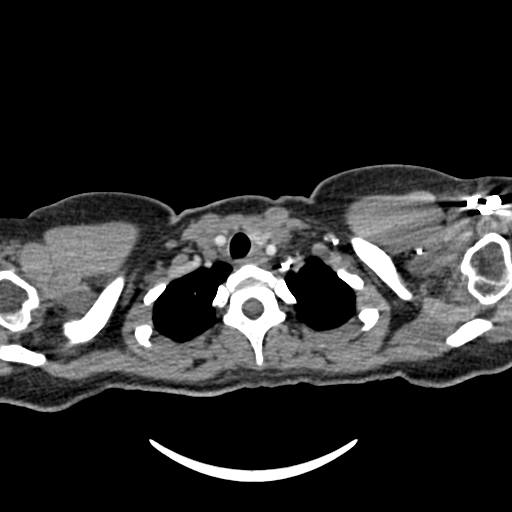
[im 234/245  lung]
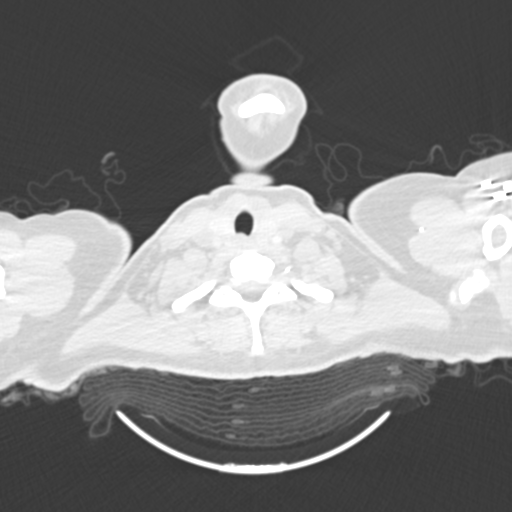

[Series 7: coronal mpr · coronal · 0.49mm/px · 3 of 71 slices shown]
[im 18/71  soft-tissue]
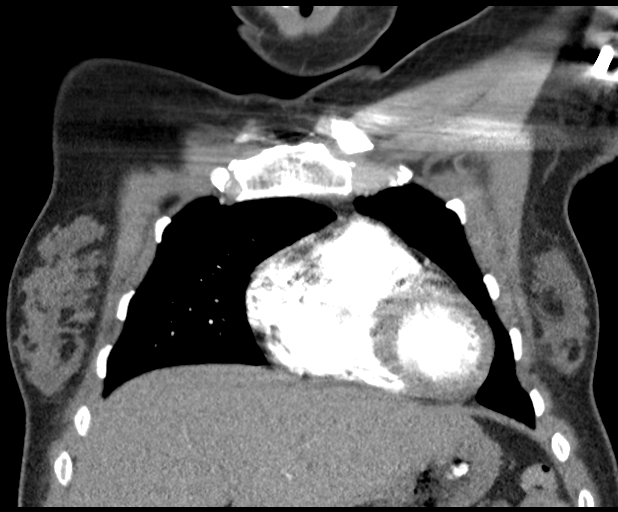
[im 36/71  soft-tissue]
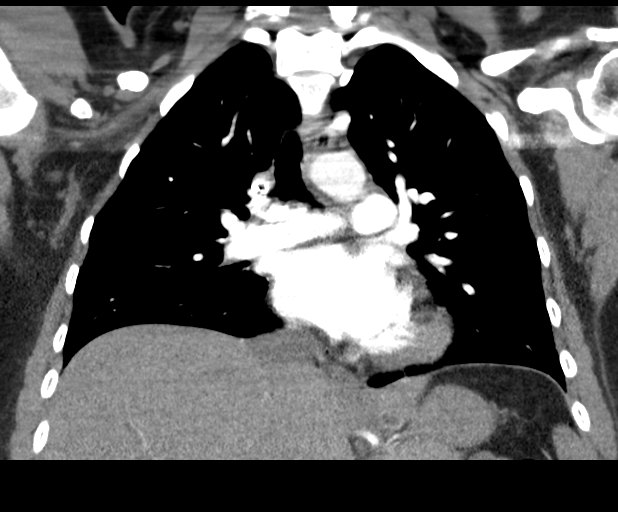
[im 53/71  soft-tissue]
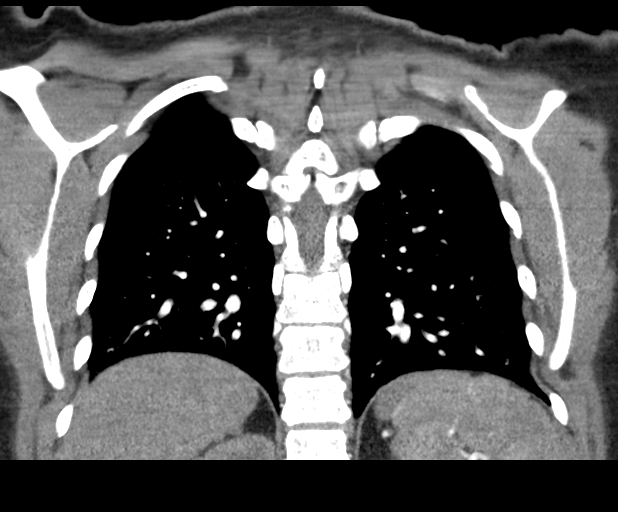

[18 of 46 positions shown; findings below may reference images not displayed]

FINDINGS: Cardiovascular: There is a optimal opacification of the pulmonary
arteries. There is no central,segmental, or subsegmental filling
defects within the pulmonary arteries. The heart is normal in size.
No pericardial effusion or thickening. No evidence right heart
strain. There is normal three-vessel brachiocephalic anatomy without
proximal stenosis. The thoracic aorta is normal in appearance.

Mediastinum/Nodes: No hilar, mediastinal, or axillary adenopathy.
Thyroid gland, trachea, and esophagus demonstrate no significant
findings.

Lungs/Pleura: Minimal bibasilar dependent ground-glass atelectasis
is noted. No pleural effusion or pneumothorax. No airspace
consolidation.

Upper Abdomen: No acute abnormalities present in the visualized
portions of the upper abdomen.

Musculoskeletal: No chest wall abnormality. No acute or significant
osseous findings.

Review of the MIP images confirms the above findings.
IMPRESSION: No central, segmental, or subsegmental pulmonary embolism.

Minimal bibasilar dependent atelectasis.
